# Patient Record
Sex: Female | Born: 1955
Health system: Southern US, Community
[De-identification: ages and names within clinical notes are randomized; demographics above are authoritative.]

## PROBLEM LIST (undated history)

## (undated) DIAGNOSIS — E039 Hypothyroidism, unspecified: Secondary | ICD-10-CM

## (undated) DIAGNOSIS — M1990 Unspecified osteoarthritis, unspecified site: Secondary | ICD-10-CM

## (undated) HISTORY — DX: Unspecified osteoarthritis, unspecified site: M19.90

## (undated) HISTORY — PX: JOINT REPLACEMENT: SHX530

---

## 2002-12-25 ENCOUNTER — Encounter: Payer: Self-pay | Admitting: Family Medicine

## 2002-12-25 ENCOUNTER — Ambulatory Visit (HOSPITAL_COMMUNITY): Admission: RE | Admit: 2002-12-25 | Discharge: 2002-12-25 | Payer: Self-pay | Admitting: Family Medicine

## 2005-05-25 ENCOUNTER — Other Ambulatory Visit: Admission: RE | Admit: 2005-05-25 | Discharge: 2005-05-25 | Payer: Self-pay | Admitting: Endocrinology

## 2005-09-13 ENCOUNTER — Encounter (INDEPENDENT_AMBULATORY_CARE_PROVIDER_SITE_OTHER): Payer: Self-pay | Admitting: *Deleted

## 2005-09-13 ENCOUNTER — Ambulatory Visit (HOSPITAL_COMMUNITY): Admission: RE | Admit: 2005-09-13 | Discharge: 2005-09-14 | Payer: Self-pay | Admitting: Surgery

## 2007-08-12 ENCOUNTER — Other Ambulatory Visit: Admission: RE | Admit: 2007-08-12 | Discharge: 2007-08-12 | Payer: Self-pay | Admitting: Family Medicine

## 2011-03-26 ENCOUNTER — Other Ambulatory Visit: Payer: Self-pay | Admitting: Internal Medicine

## 2011-03-29 ENCOUNTER — Ambulatory Visit
Admission: RE | Admit: 2011-03-29 | Discharge: 2011-03-29 | Disposition: A | Discharge status: Home / Self Care | Payer: 59 | Source: Ambulatory Visit | Attending: Internal Medicine | Admitting: Internal Medicine

## 2011-04-20 NOTE — Op Note (Signed)
NAMECHARNETTE, YOUNKIN NO.:  [id number]   MEDICAL RECORD NO.:  [id number]          PATIENT TYPE:  AMB   LOCATION:  DAY                          FACILITY:  [facility]   PHYSICIAN:  Velora Heckler, MD      DATE OF BIRTH:  March 15, 1956   DATE OF PROCEDURE:  09/13/2005  DATE OF DISCHARGE:                                 OPERATIVE REPORT   PREOPERATIVE DIAGNOSIS:  Right thyroid nodule.   POSTOPERATIVE DIAGNOSIS:  Right thyroid nodule.   PROCEDURE:  Right thyroid lobectomy.   SURGEON:  Velora Heckler, MD.   ASSISTANT:  Ovidio Kin, MD   ANESTHESIA:  General.   ESTIMATED BLOOD LOSS:  Minimal.   PREPARATION:  Betadine.   COMPLICATIONS:  None.   INDICATIONS:  The patient is a 55 year old white female from [place],  [place] referred by Dr. Dorisann Frames with right thyroid nodule.  This was found on self examination April 2006. The patient was sent to  [facility] where ultrasound demonstrated a 2.4 cm complex cystic  and solid mass in the inferior pole of the right thyroid lobe. Fine-needle  aspiration was obtained which demonstrated follicular cells with atypia.  Hurthle cell change was noted. The patient now comes to surgery for  resection.   DESCRIPTION OF PROCEDURE:  The procedure was done in OR #1 at the [facility]. The patient is brought to the operating room,  placed in a supine position on the operating room table. Following the  administration of general anesthesia, the patient is positioned and then  prepped and draped in the usual strict aseptic fashion. After ascertaining  that an adequate level of anesthesia had been obtained, a Kocher incision  was made with a #15 blade. Dissection was carried down through the  subcutaneous tissues and platysma, hemostasis was obtained with the  electrocautery. Skin flaps were elevated cephalad and caudad from the  thyroid notch to the sternal notch. A Mahorner self-retaining  retractor was  placed for exposure. The strap muscles are incised in the midline. The left  lobe is initially exposed. On palpation, it appears grossly normal. There  are no dominant masses.   Next we turned our attention to the right thyroid lobe. The strap muscles  are reflected laterally. Venous tributaries are divided between small  hemoclips. The gland is gently mobilized and rolled anteriorly. Large  inferior venous tributaries are ligated in continuity with 2-0 silk ties and  divided. The superior pole vessels are dissected out, ligated in continuity  with 2-0 silk ties and medium ligaclips and divided. The gland is rolled  further anteriorly. The superior parathyroid gland is identified and  preserved. Recurrent laryngeal nerve is quite prominent. It is followed  along its course and preserved. Branches of the inferior thyroid artery are  divided between small ligaclips. The ligament of Allyson Sabal is transected with  electrocautery and the gland is rolled anteriorly onto the anterior surface  of the trachea. The remaining venous tributaries are divided between small  hemoclips. The gland is mobilized across the isthmus. The isthmus is  transected at  its junction with the left thyroid lobe between hemostats. The  left lobe is suture ligated with 3-0 Vicryl suture ligatures. The right  thyroid lobe and isthmus is submitted to pathology. Dr. Star Age performed  frozen section analysis and confirms a benign nodule likely representing a  hyperplastic nodule. Hurthle cell change is noted.   The neck is irrigated with warm saline. The Surgicel is placed over the area  of the recurrent laryngeal nerve. Good hemostasis is noted. The strap  muscles are reapproximated in the midline with interrupted 3-0 Vicryl  sutures. The platysma is closed with interrupted 3-0 Vicryl sutures. The  skin is closed with running 4-0 Vicryl subcuticular suture. The wound is  washed and dried and Benzoin and  Steri-Strips are applied. Sterile dressings  are applied. The patient is awakened from anesthesia and brought to the  recovery room in stable condition. The patient tolerated the procedure well.      Velora Heckler, MD  Electronically Signed     TMG/MEDQ  D:  09/13/2005  T:  09/13/2005  Job:  [id number]   cc:   Dorisann Frames, M.D.  Fax: [phone]   Stacie Acres. Cliffton Asters, M.D.  Fax: [phone]

## 2016-07-03 ENCOUNTER — Other Ambulatory Visit: Payer: Self-pay | Admitting: Internal Medicine

## 2016-07-03 DIAGNOSIS — E042 Nontoxic multinodular goiter: Secondary | ICD-10-CM

## 2016-07-12 ENCOUNTER — Ambulatory Visit
Admission: RE | Admit: 2016-07-12 | Discharge: 2016-07-12 | Disposition: A | Discharge status: Home / Self Care | Payer: BLUE CROSS/BLUE SHIELD | Source: Ambulatory Visit | Attending: Internal Medicine | Admitting: Internal Medicine

## 2016-07-12 DIAGNOSIS — E042 Nontoxic multinodular goiter: Secondary | ICD-10-CM

## 2017-08-21 DIAGNOSIS — J301 Allergic rhinitis due to pollen: Secondary | ICD-10-CM | POA: Diagnosis not present

## 2017-08-21 DIAGNOSIS — J3089 Other allergic rhinitis: Secondary | ICD-10-CM | POA: Diagnosis not present

## 2017-08-21 DIAGNOSIS — J300 Vasomotor rhinitis: Secondary | ICD-10-CM | POA: Diagnosis not present

## 2018-12-08 DIAGNOSIS — E039 Hypothyroidism, unspecified: Secondary | ICD-10-CM | POA: Diagnosis not present

## 2018-12-08 DIAGNOSIS — E042 Nontoxic multinodular goiter: Secondary | ICD-10-CM | POA: Diagnosis not present

## 2019-03-02 ENCOUNTER — Other Ambulatory Visit: Payer: Self-pay

## 2019-03-02 ENCOUNTER — Encounter (HOSPITAL_COMMUNITY): Payer: Self-pay

## 2019-03-02 ENCOUNTER — Inpatient Hospital Stay (HOSPITAL_COMMUNITY)
Admission: EM | Admit: 2019-03-02 | Discharge: 2019-03-05 | DRG: 470 | Disposition: A | Discharge status: Home Health | Payer: BLUE CROSS/BLUE SHIELD | Attending: Family Medicine | Admitting: Family Medicine

## 2019-03-02 ENCOUNTER — Emergency Department (HOSPITAL_COMMUNITY): Payer: BLUE CROSS/BLUE SHIELD

## 2019-03-02 ENCOUNTER — Inpatient Hospital Stay (HOSPITAL_COMMUNITY): Payer: BLUE CROSS/BLUE SHIELD

## 2019-03-02 DIAGNOSIS — Z1231 Encounter for screening mammogram for malignant neoplasm of breast: Secondary | ICD-10-CM | POA: Insufficient documentation

## 2019-03-02 DIAGNOSIS — Z0000 Encounter for general adult medical examination without abnormal findings: Secondary | ICD-10-CM | POA: Insufficient documentation

## 2019-03-02 DIAGNOSIS — S50311A Abrasion of right elbow, initial encounter: Secondary | ICD-10-CM | POA: Diagnosis present

## 2019-03-02 DIAGNOSIS — J302 Other seasonal allergic rhinitis: Secondary | ICD-10-CM | POA: Diagnosis present

## 2019-03-02 DIAGNOSIS — Z79890 Hormone replacement therapy (postmenopausal): Secondary | ICD-10-CM | POA: Diagnosis not present

## 2019-03-02 DIAGNOSIS — E876 Hypokalemia: Secondary | ICD-10-CM

## 2019-03-02 DIAGNOSIS — Y93K1 Activity, walking an animal: Secondary | ICD-10-CM | POA: Diagnosis not present

## 2019-03-02 DIAGNOSIS — I447 Left bundle-branch block, unspecified: Secondary | ICD-10-CM | POA: Diagnosis not present

## 2019-03-02 DIAGNOSIS — S72001A Fracture of unspecified part of neck of right femur, initial encounter for closed fracture: Secondary | ICD-10-CM | POA: Diagnosis not present

## 2019-03-02 DIAGNOSIS — R531 Weakness: Secondary | ICD-10-CM | POA: Diagnosis not present

## 2019-03-02 DIAGNOSIS — Y929 Unspecified place or not applicable: Secondary | ICD-10-CM

## 2019-03-02 DIAGNOSIS — W1839XA Other fall on same level, initial encounter: Secondary | ICD-10-CM | POA: Diagnosis not present

## 2019-03-02 DIAGNOSIS — Z96641 Presence of right artificial hip joint: Secondary | ICD-10-CM

## 2019-03-02 DIAGNOSIS — Z01818 Encounter for other preprocedural examination: Secondary | ICD-10-CM

## 2019-03-02 DIAGNOSIS — Z471 Aftercare following joint replacement surgery: Secondary | ICD-10-CM | POA: Diagnosis not present

## 2019-03-02 DIAGNOSIS — S72041A Displaced fracture of base of neck of right femur, initial encounter for closed fracture: Secondary | ICD-10-CM | POA: Diagnosis not present

## 2019-03-02 DIAGNOSIS — S72091A Other fracture of head and neck of right femur, initial encounter for closed fracture: Secondary | ICD-10-CM | POA: Diagnosis not present

## 2019-03-02 DIAGNOSIS — E039 Hypothyroidism, unspecified: Secondary | ICD-10-CM | POA: Diagnosis not present

## 2019-03-02 DIAGNOSIS — S72011A Unspecified intracapsular fracture of right femur, initial encounter for closed fracture: Secondary | ICD-10-CM | POA: Diagnosis not present

## 2019-03-02 DIAGNOSIS — Z419 Encounter for procedure for purposes other than remedying health state, unspecified: Secondary | ICD-10-CM

## 2019-03-02 DIAGNOSIS — S60222A Contusion of left hand, initial encounter: Secondary | ICD-10-CM | POA: Diagnosis present

## 2019-03-02 DIAGNOSIS — Z23 Encounter for immunization: Secondary | ICD-10-CM | POA: Diagnosis not present

## 2019-03-02 DIAGNOSIS — M79642 Pain in left hand: Secondary | ICD-10-CM | POA: Diagnosis not present

## 2019-03-02 DIAGNOSIS — W19XXXA Unspecified fall, initial encounter: Secondary | ICD-10-CM | POA: Diagnosis not present

## 2019-03-02 HISTORY — DX: Hypothyroidism, unspecified: E03.9

## 2019-03-02 LAB — CBC WITH DIFFERENTIAL/PLATELET
Abs Immature Granulocytes: 0.07 10*3/uL (ref 0.00–0.07)
Basophils Absolute: 0 10*3/uL (ref 0.0–0.1)
Basophils Relative: 0 %
Eosinophils Absolute: 0 10*3/uL (ref 0.0–0.5)
Eosinophils Relative: 0 %
HCT: 44.3 % (ref 36.0–46.0)
Hemoglobin: 14.6 g/dL (ref 12.0–15.0)
Immature Granulocytes: 1 %
Lymphocytes Relative: 9 %
Lymphs Abs: 1 10*3/uL (ref 0.7–4.0)
MCH: 33.1 pg (ref 26.0–34.0)
MCHC: 33 g/dL (ref 30.0–36.0)
MCV: 100.5 fL — ABNORMAL HIGH (ref 80.0–100.0)
Monocytes Absolute: 0.5 10*3/uL (ref 0.1–1.0)
Monocytes Relative: 4 %
Neutro Abs: 9.9 10*3/uL — ABNORMAL HIGH (ref 1.7–7.7)
Neutrophils Relative %: 86 %
Platelets: 245 10*3/uL (ref 150–400)
RBC: 4.41 MIL/uL (ref 3.87–5.11)
RDW: 12.2 % (ref 11.5–15.5)
WBC: 11.6 10*3/uL — ABNORMAL HIGH (ref 4.0–10.5)
nRBC: 0 % (ref 0.0–0.2)

## 2019-03-02 LAB — PROTIME-INR
INR: 1 (ref 0.8–1.2)
Prothrombin Time: 12.8 seconds (ref 11.4–15.2)

## 2019-03-02 LAB — BASIC METABOLIC PANEL
Anion gap: 8 (ref 5–15)
BUN: 15 mg/dL (ref 8–23)
CO2: 26 mmol/L (ref 22–32)
Calcium: 9.3 mg/dL (ref 8.9–10.3)
Chloride: 105 mmol/L (ref 98–111)
Creatinine, Ser: 0.84 mg/dL (ref 0.44–1.00)
GFR calc Af Amer: >60 mL/min (ref >60)
GFR calc non Af Amer: >60 mL/min (ref >60)
Glucose, Bld: 113 mg/dL — ABNORMAL HIGH (ref 70–99)
Potassium: 3.4 mmol/L — ABNORMAL LOW (ref 3.5–5.1)
Sodium: 139 mmol/L (ref 135–145)

## 2019-03-02 LAB — ABO/RH: ABO/RH(D): A POS

## 2019-03-02 MED ORDER — MORPHINE SULFATE (PF) 4 MG/ML IV SOLN
4.0000 mg | INTRAVENOUS | Status: DC | PRN
  Administered 2019-03-02: 4 mg via INTRAVENOUS
  Filled 2019-03-02: qty 1

## 2019-03-02 MED ORDER — FENTANYL CITRATE (PF) 100 MCG/2ML IJ SOLN
50.0000 ug | Freq: Once | INTRAMUSCULAR | Status: DC
  Filled 2019-03-02: qty 2

## 2019-03-02 MED ORDER — METHOCARBAMOL 500 MG PO TABS
500.0000 mg | ORAL_TABLET | Freq: Four times a day (QID) | ORAL | Status: DC | PRN
  Administered 2019-03-02 – 2019-03-03 (×2): 500 mg via ORAL
  Filled 2019-03-02 (×2): qty 1

## 2019-03-02 MED ORDER — POTASSIUM CHLORIDE CRYS ER 20 MEQ PO TBCR
40.0000 meq | EXTENDED_RELEASE_TABLET | Freq: Once | ORAL | Status: AC
  Administered 2019-03-02: 40 meq via ORAL
  Filled 2019-03-02: qty 2

## 2019-03-02 MED ORDER — VITAMIN C 500 MG PO TABS
500.0000 mg | ORAL_TABLET | Freq: Every day | ORAL | Status: DC
  Administered 2019-03-04 – 2019-03-05 (×2): 500 mg via ORAL
  Filled 2019-03-02 (×2): qty 1

## 2019-03-02 MED ORDER — POLYETHYLENE GLYCOL 3350 17 G PO PACK: 17.0000 g | PACK | Freq: Every day | ORAL | Status: DC | PRN

## 2019-03-02 MED ORDER — LEVOTHYROXINE SODIUM 88 MCG PO TABS
88.0000 ug | ORAL_TABLET | Freq: Every day | ORAL | Status: DC
  Administered 2019-03-03 – 2019-03-05 (×3): 88 ug via ORAL
  Filled 2019-03-02 (×3): qty 1

## 2019-03-02 MED ORDER — TETANUS-DIPHTH-ACELL PERTUSSIS 5-2.5-18.5 LF-MCG/0.5 IM SUSP
0.5000 mL | Freq: Once | INTRAMUSCULAR | Status: AC
  Administered 2019-03-02: 0.5 mL via INTRAMUSCULAR
  Filled 2019-03-02: qty 0.5

## 2019-03-02 MED ORDER — VITAMIN D 25 MCG (1000 UNIT) PO TABS
2000.0000 [IU] | ORAL_TABLET | Freq: Two times a day (BID) | ORAL | Status: DC
  Administered 2019-03-02 – 2019-03-05 (×5): 2000 [IU] via ORAL
  Filled 2019-03-02 (×5): qty 2

## 2019-03-02 MED ORDER — MORPHINE SULFATE (PF) 2 MG/ML IV SOLN
1.0000 mg | INTRAVENOUS | Status: DC | PRN
  Administered 2019-03-02 – 2019-03-03 (×3): 2 mg via INTRAVENOUS
  Filled 2019-03-02 (×3): qty 1

## 2019-03-02 MED ORDER — LORATADINE 10 MG PO TABS
10.0000 mg | ORAL_TABLET | Freq: Every day | ORAL | Status: DC
  Administered 2019-03-02 – 2019-03-04 (×3): 10 mg via ORAL
  Filled 2019-03-02 (×3): qty 1

## 2019-03-02 MED ORDER — METHOCARBAMOL 1000 MG/10ML IJ SOLN
500.0000 mg | Freq: Four times a day (QID) | INTRAVENOUS | Status: DC | PRN
  Filled 2019-03-02: qty 5

## 2019-03-02 NOTE — ED Provider Notes (Signed)
[facility] EMERGENCY DEPARTMENT Provider Note   CSN: [id number] Arrival date & time: 03/02/19  1541    History   Chief Complaint Chief Complaint  Patient presents with  . Fall    HPI Rebecca Sims is a 63 y.o. female with a past medical history of hypothyroidism, treated with Synthroid, who presents today for evaluation after a fall.  She was outside walking her dog when her dog lunged at a squirrel pulling her to the ground.  She did not strike her head or pass out.  She denies any headache, neck pain, or back pain.  She scraped her right elbow and has pain and bruising on her left hand.  She reports the area of most pain is in her right hip.  She says that it feels like spasms.  The pain is a 2-3 until the spasm hits and then it becomes a 8 out of 10.  She denies any fevers or cough.  She reports that she has not been able to bear weight on her right hip since she fell.       HPI  History reviewed. No pertinent past medical history.  There are no active problems to display for this patient.   History reviewed. No pertinent surgical history.   OB History   No obstetric history on file.      Home Medications    Prior to Admission medications   Not on File    Family History History reviewed. No pertinent family history.  Social History Social History   Tobacco Use  . Smoking status: Not on file  Substance Use Topics  . Alcohol use: Not on file  . Drug use: Not on file     Allergies   Patient has no known allergies.   Review of Systems Review of Systems  Constitutional: Negative for chills and fever.  HENT: Negative for congestion.   Respiratory: Negative for choking and shortness of breath.   Cardiovascular: Negative for chest pain.  Gastrointestinal: Negative for abdominal pain.  Musculoskeletal: Negative for back pain and neck pain.  Skin: Positive for wound.  Neurological: Negative for dizziness, weakness and headaches.   Psychiatric/Behavioral: Negative for confusion.     Physical Exam Updated Vital Signs BP (!) 123/59 (BP Location: Right Arm)   Pulse 74   Resp 18   Ht 5\' 4"  (1.626 m)   Wt 72.6 kg   SpO2 99%   BMI 27.46 kg/m   Physical Exam Vitals signs and nursing note reviewed.  Constitutional:      General: She is not in acute distress.    Appearance: She is well-developed.  HENT:     Head: Normocephalic and atraumatic. No raccoon eyes, Battle's sign, abrasion or contusion.     Right Ear: Tympanic membrane, ear canal and external ear normal.     Left Ear: Tympanic membrane, ear canal and external ear normal.  Eyes:     Conjunctiva/sclera: Conjunctivae normal.  Neck:     Musculoskeletal: Full passive range of motion without pain, normal range of motion and neck supple. No neck rigidity or muscular tenderness.  Cardiovascular:     Rate and Rhythm: Normal rate and regular rhythm.     Pulses: Normal pulses.          Posterior tibial pulses are 2+ on the right side and 2+ on the left side.     Heart sounds: No murmur.  Pulmonary:     Effort: Pulmonary effort is normal.  No respiratory distress.     Breath sounds: Normal breath sounds.  Abdominal:     Palpations: Abdomen is soft.     Tenderness: There is no abdominal tenderness.  Musculoskeletal:     Comments: There is pain with motion of the left hip.  Unable to perform any significant range of motion secondary to pain.  There is obvious bruising of the left hand.  There is generalized tenderness to palpation of the left hand.  There is no tenderness to palpation crepitus or deformities of the left wrist, no tenderness to palpation over anatomic snuffbox bilaterally.  Full pain-free range of motion of right upper extremity and left lower extremity.  Skin:    General: Skin is warm and dry.     Comments: Large superficial abrasion present to right posterior elbow.  Neurological:     Mental Status: She is alert.      ED Treatments /  Results  Labs (all labs ordered are listed, but only abnormal results are displayed) Labs Reviewed  BASIC METABOLIC PANEL - Abnormal; Notable for the following components:      Result Value   Potassium 3.4 (*)    Glucose, Bld 113 (*)    All other components within normal limits  CBC WITH DIFFERENTIAL/PLATELET - Abnormal; Notable for the following components:   WBC 11.6 (*)    MCV 100.5 (*)    Neutro Abs 9.9 (*)    All other components within normal limits  PROTIME-INR  TYPE AND SCREEN    EKG EKG Interpretation  Date/Time:  Monday March 02 2019 17:19:49 EDT Ventricular Rate:  82 PR Interval:    QRS Duration: 153 QT Interval:  423 QTC Calculation: 495 R Axis:   -14 Text Interpretation:  Sinus rhythm Left bundle branch block Otherwise no significant change Confirmed by Melene Plan [phone]) on 03/02/2019 5:39:28 PM   Radiology Dg Hand Complete Left  Result Date: 03/02/2019 CLINICAL DATA:  Left hand pain after fall today. EXAM: LEFT HAND - COMPLETE 3+ VIEW COMPARISON:  None. FINDINGS: Mild degenerative changes over the radiocarpal joint. No acute fracture or dislocation. IMPRESSION: No acute findings. Electronically Signed   By: Elberta Fortis M.D.   On: 03/02/2019 16:56   Dg Hip Unilat With Pelvis 2-3 Views Right  Result Date: 03/02/2019 CLINICAL DATA:  Right hip pain after fall today. EXAM: DG HIP (WITH OR WITHOUT PELVIS) 2-3V RIGHT COMPARISON:  None. FINDINGS: Examination demonstrates a minimally displaced subcapital fracture of the right femoral neck. Mild fecal retention over the rectum. Remaining bony and soft tissue structures are unremarkable. IMPRESSION: Minimally displaced subcapital right femoral neck fracture. Electronically Signed   By: Elberta Fortis M.D.   On: 03/02/2019 16:55    Procedures Procedures (including critical care time)  Medications Ordered in ED Medications  morphine 4 MG/ML injection 4 mg (4 mg Intravenous Given 03/02/19 1655)  Tdap (BOOSTRIX)  injection 0.5 mL (0.5 mLs Intramuscular Given 03/02/19 1658)     Initial Impression / Assessment and Plan / ED Course  I have reviewed the triage vital signs and the nursing notes.  Pertinent labs & imaging results that were available during my care of the patient were reviewed by me and considered in my medical decision making (see chart for details).  Clinical Course as of Mar 01 1833  Mon Mar 02, 2019  1723 Spoke with Dr. Carola Frost who reccomends hospital admit.  He is concerned that she may need a total hip replacement, he is going to  touch base with 1 of his colleagues who does full joint.  He is unsure if she will go to the OR tonight or tomorrow.   [EH]    Clinical Course User Index [EH] Cristina Gong, PA-C      Rebecca Sims is a 61 woman who presents today for evaluation of right hip pain.  She was walking her dog outside when he reportedly saw a squirrel and pulled her down.  She has immediate onset of pain in her right hip and has been unable to bear weight.    Right hip x-rays were obtained showing a minimally displaced subcapital right femoral neck fracture.  She also had bruising and generalized pain in her left hand, x-rays were obtained without evidence of fracture.  She does not take any blood thinning medications, did not strike her head or pass out.  She does not have any evidence of significant head trauma, therefore no indication for CT scan of head.  She does not have any neck pain, has full active pain-free range of motion with normal sensation and function in bilateral upper extremities, no indication for CT scan of neck.  She had a large abrasion over her right arm, orders were placed for wound care.    She is unsure when her last tetanus shot was, therefore Tdap was updated.  Spoke with Dr. Carola Frost from orthopedics who will operate on patient tomorrow, she is to be n.p.o. at midnight tonight.  I spoke with Dr. Antionette Char from hospitalist team who will admit patient.   She remained hemodynamically stable while in the emergency room.  This patient was seen as a shared visit with Dr. Adela Lank.  Final Clinical Impressions(s) / ED Diagnoses   Final diagnoses:  Closed right hip fracture, initial encounter [facility]    ED Discharge Orders    None       Cristina Gong, PA-C 03/02/19 1843    Melene Plan, DO 03/02/19 1845

## 2019-03-02 NOTE — H&P (Signed)
History and Physical    TANVEE LAURO VXB:[id number] DOB: 1956-11-20 DOA: 03/02/2019  PCP: Patient, No Pcp Per   Patient coming from: home   Chief Complaint: Fall with right hip pain  HPI: TYAISA ASCHOFF is a 63 y.o. female with medical history significant for seasonal allergy and hypothyroidism, now presenting to the emergency department for evaluation of right hip pain after mechanical fall.  The patient was in her usual state of good health, having an uneventful day, and was walking her dog when the dog lunged and knocked her to the ground.  She fell onto her right side without hitting her head or losing consciousness, experienced immediate and severe pain at the right hip, and now presents for evaluation.  She also had some pain at the left elbow and right hand with abrasions.  Patient reports that she is physically active, walking her dog daily, ascending stairs without any dyspnea, and never experiences any chest pain or chest discomfort with activity.  She denies any history of heart or lung disease.  She denies any recent fevers, chills, cough, shortness of breath, dysuria, abdominal pain, or diarrhea.  ED Course: Upon arrival to the ED, patient is found to be saturating well on room air, and with remaining vitals also normal.  EKG features a sinus rhythm with left bundle branch block.  Radiographs of the left hand are negative and hip films demonstrate minimally displaced subcapsular right femoral neck fracture.  Chemistry panel is notable for slight hypokalemia and CBC features a mild leukocytosis.  Tdap was updated, morphine was given for pain control, type and screen was performed, and orthopedic surgery was consulted by the ED physician, recommending a medical admission with tentative plans for surgery tomorrow.  Review of Systems:  All other systems reviewed and apart from HPI, are negative.  Past Medical History:  Diagnosis Date  . Hypothyroidism     History reviewed. No  pertinent surgical history.   has no history on file for tobacco, alcohol, and drug.  No Known Allergies  History reviewed. No pertinent family history.   Prior to Admission medications   Not on File    Physical Exam: Vitals:   03/02/19 1549 03/02/19 1603  BP: (!) 123/59   Pulse: 74   Resp: 18   SpO2: 99%   Weight:  72.6 kg  Height:  5\' 4"  (1.626 m)    Constitutional: NAD, calm  Eyes: PERTLA, lids and conjunctivae normal ENMT: Mucous membranes are moist. Posterior pharynx clear of any exudate or lesions.   Neck: normal, supple, no masses, no thyromegaly Respiratory: clear to auscultation bilaterally, no wheezing, no crackles. Normal respiratory effort.   Cardiovascular: S1 & S2 heard, regular rate and rhythm. No extremity edema. 2+ pedal pulses.   Abdomen: No distension, no tenderness, soft. Bowel sounds active.   Musculoskeletal: no clubbing / cyanosis. Right hip tender, neurovascularly intact distally.   Skin: no significant rashes, lesions, ulcers. Warm, dry, well-perfused. Neurologic: CN 2-12 grossly intact. Sensation intact. Strength 5/5 in all 4 limbs.  Psychiatric: Alert and oriented x 3. Pleasant, cooperative.    Labs on Admission: I have personally reviewed following labs and imaging studies  CBC: Recent Labs  Lab 03/02/19 1703  WBC 11.6*  NEUTROABS 9.9*  HGB 14.6  HCT 44.3  MCV 100.5*  PLT 245   Basic Metabolic Panel: Recent Labs  Lab 03/02/19 1703  NA 139  K 3.4*  CL 105  CO2 26  GLUCOSE 113*  BUN 15  CREATININE 0.84  CALCIUM 9.3   GFR: Estimated Creatinine Clearance: 68.7 mL/min (by C-G formula based on SCr of 0.84 mg/dL). Liver Function Tests: No results for input(s): AST, ALT, ALKPHOS, BILITOT, PROT, ALBUMIN in the last 168 hours. No results for input(s): LIPASE, AMYLASE in the last 168 hours. No results for input(s): AMMONIA in the last 168 hours. Coagulation Profile: Recent Labs  Lab 03/02/19 1703  INR 1.0   Cardiac Enzymes:  No results for input(s): CKTOTAL, CKMB, CKMBINDEX, TROPONINI in the last 168 hours. BNP (last 3 results) No results for input(s): PROBNP in the last 8760 hours. HbA1C: No results for input(s): HGBA1C in the last 72 hours. CBG: No results for input(s): GLUCAP in the last 168 hours. Lipid Profile: No results for input(s): CHOL, HDL, LDLCALC, TRIG, CHOLHDL, LDLDIRECT in the last 72 hours. Thyroid Function Tests: No results for input(s): TSH, T4TOTAL, FREET4, T3FREE, THYROIDAB in the last 72 hours. Anemia Panel: No results for input(s): VITAMINB12, FOLATE, FERRITIN, TIBC, IRON, RETICCTPCT in the last 72 hours. Urine analysis: No results found for: COLORURINE, APPEARANCEUR, LABSPEC, PHURINE, GLUCOSEU, HGBUR, BILIRUBINUR, KETONESUR, PROTEINUR, UROBILINOGEN, NITRITE, LEUKOCYTESUR Sepsis Labs: @LABRCNTIP (procalcitonin:4,lacticidven:4) )No results found for this or any previous visit (from the past 240 hour(s)).   Radiological Exams on Admission: Dg Hand Complete Left  Result Date: 03/02/2019 CLINICAL DATA:  Left hand pain after fall today. EXAM: LEFT HAND - COMPLETE 3+ VIEW COMPARISON:  None. FINDINGS: Mild degenerative changes over the radiocarpal joint. No acute fracture or dislocation. IMPRESSION: No acute findings. Electronically Signed   By: Elberta Fortis M.D.   On: 03/02/2019 16:56   Dg Hip Unilat With Pelvis 2-3 Views Right  Result Date: 03/02/2019 CLINICAL DATA:  Right hip pain after fall today. EXAM: DG HIP (WITH OR WITHOUT PELVIS) 2-3V RIGHT COMPARISON:  None. FINDINGS: Examination demonstrates a minimally displaced subcapital fracture of the right femoral neck. Mild fecal retention over the rectum. Remaining bony and soft tissue structures are unremarkable. IMPRESSION: Minimally displaced subcapital right femoral neck fracture. Electronically Signed   By: Elberta Fortis M.D.   On: 03/02/2019 16:55    EKG: Independently reviewed. Sinus rhythm, LBBB, no priors available.    Assessment/Plan   1. Right hip fracture  - Presents with right hip pain after a mechanical fall  - Radiographs with minimally displaced subcapsular right femoral neck fracture  - Orthopedic surgery is consulting and much appreciated  - Based on the available data, Ms. Bohnen presents an estimated 0.14% risk of perioperative MI or cardiac arrest  - Continue pain-control and supportive care, keep NPO after midnight    2. Hypothyroidism  - Patient uncertain of Synthroid dose, will plan to continue pending pharmacy med-rec   3. LBBB  - LBBB noted on admission EKG with no priors available  - The patient is physically active, denies any known hx of heart disease, never experiences anginal sxs, ascends stairs without SOB   - No further testing needed prior to surgical hip repair    4. Hypokalemia  - Serum potassium slightly low in ED and replaced  - Repeat chem panel in am     DVT prophylaxis: SCD's  Code Status: Full  Family Communication: Discussed with patient  Consults called: Orthopedic surgery  Admission status: Inpatient     Briscoe Deutscher, MD [facility] Pager [phone]  If 7PM-7AM, please contact night-coverage [email or web address] Password [email or web address]  03/02/2019, 6:59 PM

## 2019-03-02 NOTE — Consult Note (Signed)
Orthopaedic Trauma Service Consultation  Reason for Consult: Displaced right femoral neck fracture Referring Physician: Odie Sera, MD  Rebecca Sims is an 63 y.o. female.  HPI: Patient was pulled down while walking her [place] shepherd today. 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 Skinned right elbow in the fall but denies any other complaints or LOC. Right hip was in a flexed position but now more comfortable semi-extended position.  Past Medical History:  Diagnosis Date  . Hypothyroidism     History reviewed. No pertinent surgical history.  History reviewed. No pertinent family history.  Social History:  has no history on file for tobacco, alcohol, and drug. Never smoked; works as part Science writer for [employer]; daughter lives in [place]; lives alone in town home with dog who chewed water pipe into and flooded bottom floor tonight, bedroom and full bath are both on the second floor.  Allergies: No Known Allergies  Medications: I have reviewed the patient's current medications.  Results for orders placed or performed during the hospital encounter of 03/02/19 (from the past 48 hour(s))  Basic metabolic panel     Status: Abnormal   Collection Time: 03/02/19  5:03 PM  Result Value Ref Range   Sodium 139 135 - 145 mmol/L   Potassium 3.4 (L) 3.5 - 5.1 mmol/L   Chloride 105 98 - 111 mmol/L   CO2 26 22 - 32 mmol/L   Glucose, Bld 113 (H) 70 - 99 mg/dL   BUN 15 8 - 23 mg/dL   Creatinine, Ser 1.61 0.44 - 1.00 mg/dL   Calcium 9.3 8.9 - 09.6 mg/dL   GFR calc non Af Amer >60 >60 mL/min   GFR calc Af Amer >60 >60 mL/min   Anion gap 8 5 - 15    Comment: Performed at [facility], [address] [place], [place] [address]  CBC WITH DIFFERENTIAL     Status: Abnormal   Collection Time: 03/02/19  5:03 PM   Result Value Ref Range   WBC 11.6 (H) 4.0 - 10.5 K/uL   RBC 4.41 3.87 - 5.11 MIL/uL   Hemoglobin 14.6 12.0 - 15.0 g/dL   HCT 98.1 19.1 - 47.8 %   MCV 100.5 (H) 80.0 - 100.0 fL   MCH 33.1 26.0 - 34.0 pg   MCHC 33.0 30.0 - 36.0 g/dL   RDW 29.5 62.1 - 30.8 %   Platelets 245 150 - 400 K/uL   nRBC 0.0 0.0 - 0.2 %   Neutrophils Relative % 86 %   Neutro Abs 9.9 (H) 1.7 - 7.7 K/uL   Lymphocytes Relative 9 %   Lymphs Abs 1.0 0.7 - 4.0 K/uL   Monocytes Relative 4 %   Monocytes Absolute 0.5 0.1 - 1.0 K/uL   Eosinophils Relative 0 %   Eosinophils Absolute 0.0 0.0 - 0.5 K/uL   Basophils Relative 0 %   Basophils Absolute 0.0 0.0 - 0.1 K/uL   Immature Granulocytes 1 %   Abs Immature Granulocytes 0.07 0.00 - 0.07 K/uL    Comment: Performed at [facility], [address], [place], [place] [address]  Protime-INR     Status: None   Collection Time: 03/02/19  5:03 PM  Result Value Ref Range   Prothrombin Time 12.8 11.4 - 15.2 seconds   INR 1.0  0.8 - 1.2    Comment: (NOTE) INR goal varies based on device and disease states. Performed at [facility], [address] [place], [place] [address]   Type and screen [facility]     Status: None   Collection Time: 03/02/19  5:03 PM  Result Value Ref Range   ABO/RH(D) A POS    Antibody Screen NEG    Sample Expiration      03/05/2019 Performed at [facility], [address], [place], [place] [address]   ABO/Rh     Status: None   Collection Time: 03/02/19  5:03 PM  Result Value Ref Range   ABO/RH(D)      A POS Performed at [facility], [address] [place], [place] [address]     Chest Portable 1 View  Result Date: 03/02/2019 CLINICAL DATA:  Preop chest x-ray for hip surgery. EXAM: PORTABLE CHEST 1 VIEW COMPARISON:  09/11/2005 FINDINGS: Lungs are clear. Cardiomediastinal silhouette and remainder of the exam is unchanged. IMPRESSION: No active disease. Electronically Signed   By: Elberta Fortis  M.D.   On: 03/02/2019 19:37   Dg Hand Complete Left  Result Date: 03/02/2019 CLINICAL DATA:  Left hand pain after fall today. EXAM: LEFT HAND - COMPLETE 3+ VIEW COMPARISON:  None. FINDINGS: Mild degenerative changes over the radiocarpal joint. No acute fracture or dislocation. IMPRESSION: No acute findings. Electronically Signed   By: Elberta Fortis M.D.   On: 03/02/2019 16:56   Dg Hip Unilat With Pelvis 2-3 Views Right  Result Date: 03/02/2019 CLINICAL DATA:  Right hip pain after fall today. EXAM: DG HIP (WITH OR WITHOUT PELVIS) 2-3V RIGHT COMPARISON:  None. FINDINGS: Examination demonstrates a minimally displaced subcapital fracture of the right femoral neck. Mild fecal retention over the rectum. Remaining bony and soft tissue structures are unremarkable. IMPRESSION: Minimally displaced subcapital right femoral neck fracture. Electronically Signed   By: Elberta Fortis M.D.   On: 03/02/2019 16:55    ROS No fever, chills, GI, GU complaints, MSK as above. Blood pressure (!) 133/55, pulse 78, temperature 99 F (37.2 C), temperature source Oral, resp. rate 16, height 5\' 4"  (1.626 m), weight 72.6 kg, SpO2 98 %. Physical Exam  Very pleasant A&O x 4; appropriate for stated age RUEx shoulder, wrist, digits- no skin wounds, nontender, no instability, no blocks to motion  Elbow with large abrasion in Kerlix wrap, no bleeding through  Sens  Ax/R/M/U intact  Mot   Ax/ R/ PIN/ M/ AIN/ U intact  Rad 2+ RLE Flexed abducted hip tender  No traumatic wounds, ecchymosis, or rash  No knee or ankle effusion  Sens DPN, SPN, TN intact  Motor EHL, ext, flex, evers 5/5  DP 2+, PT 2+, No significant edema LLE No traumatic wounds, ecchymosis, or rash  Nontender  No knee or ankle effusion  Knee stable to varus/ valgus and anterior/posterior stress  Sens DPN, SPN, TN intact  Motor EHL, ext, flex, evers 5/5  DP 2+, PT 2+, No significant edema  Assessment/Plan: Displaced, intracapsular femoral neck fracture  with significant shortening H/o significant weight loss; CXR negative  I have recommended THA for maximum function and lowest risk of reoperation, although I discussed cannulated screws as a possible option. Given the need for hip replacement in setting of fracture with carries a higher risk of complications, I informed the patient that this was outside my scope of practice and that it would be in the best  interest of the patient to have these injuries evaluated and treated by a fellowship trained joint surgeon. Consequently, I asked my colleague, Dr. Charlann Boxer, to provide further evaluation and management.   Surgery is expected tomorrow, likely late am.  Myrene Galas, MD [facility], [facility] [phone]  03/02/2019  9:01 PM

## 2019-03-02 NOTE — ED Notes (Signed)
Dr Carola Frost called and stated that pt could eat until midnight. Surgery scheduled for mid morning tomorrow. Will be in to speak with pt

## 2019-03-02 NOTE — Progress Notes (Signed)
Consult request has been received.  Displaced, intracapsular right hip fracture in 63 yo female.  I would recommend THA for maximum function and lowest risk of reoperation, although cannulated screws could be considered. I have discussed this case and reviewed the x-rays directly with my colleague, Dr. Charlann Boxer, who is able to proceed tomorrow if the patient is in agreement.  Patient may eat now. Full consult to follow either tomorrow am or later tonight.Myrene Galas, MD [facility], [facility] [phone]

## 2019-03-02 NOTE — ED Triage Notes (Signed)
Pt was walking dog when dog lunged at something and knocked pt to ground.pt has abrasion on right elbow. Fell onto left hip and right hand

## 2019-03-02 NOTE — ED Notes (Signed)
ED TO INPATIENT HANDOFF REPORT  ED Nurse Name and Phone #: [phone] Idelle Crouch Name/Age/Gender Rebecca Sims 63 y.o. female Room/Bed: 013C/013C  Code Status   Code Status: Full Code  Home/SNF/Other Home   Triage Complete: Triage complete  Chief Complaint Fall; Wrist Pain  Triage Note Pt was walking dog when dog lunged at something and knocked pt to ground.pt has abrasion on right elbow. Fell onto left hip and right hand   Allergies No Known Allergies  Level of Care/Admitting Diagnosis ED Disposition    ED Disposition Condition Comment   Admit  Hospital Area: [facility] [[id number]]  Level of Care: Med-Surg [16]  Diagnosis: Closed fracture of right hip, initial encounter [facility]) [[id number]]  Admitting Physician: Briscoe Deutscher [[id number]]  Attending Physician: Briscoe Deutscher [[id number]]  Estimated length of stay: past midnight tomorrow  Certification:: I certify this patient will need inpatient services for at least 2 midnights  PT Class (Do Not Modify): Inpatient [101]  PT Acc Code (Do Not Modify): Private [1]       B Medical/Surgery History History reviewed. No pertinent past medical history. History reviewed. No pertinent surgical history.   A IV Location/Drains/Wounds Patient Lines/Drains/Airways Status   Active Line/Drains/Airways    Name:   Placement date:   Placement time:   Site:   Days:   Peripheral IV 03/02/19 Left Antecubital   03/02/19    1656    Antecubital   less than 1          Intake/Output Last 24 hours No intake or output data in the 24 hours ending 03/02/19 1839  Labs/Imaging Results for orders placed or performed during the hospital encounter of 03/02/19 (from the past 48 hour(s))  Basic metabolic panel     Status: Abnormal   Collection Time: 03/02/19  5:03 PM  Result Value Ref Range   Sodium 139 135 - 145 mmol/L   Potassium 3.4 (L) 3.5 - 5.1 mmol/L   Chloride 105 98 - 111 mmol/L   CO2 26 22 - 32 mmol/L   Glucose,  Bld 113 (H) 70 - 99 mg/dL   BUN 15 8 - 23 mg/dL   Creatinine, Ser 5.94 0.44 - 1.00 mg/dL   Calcium 9.3 8.9 - 58.5 mg/dL   GFR calc non Af Amer >60 >60 mL/min   GFR calc Af Amer >60 >60 mL/min   Anion gap 8 5 - 15    Comment: Performed at [facility], [address] [place], [place] [address]  CBC WITH DIFFERENTIAL     Status: Abnormal   Collection Time: 03/02/19  5:03 PM  Result Value Ref Range   WBC 11.6 (H) 4.0 - 10.5 K/uL   RBC 4.41 3.87 - 5.11 MIL/uL   Hemoglobin 14.6 12.0 - 15.0 g/dL   HCT 46.2 86.3 - 81.7 %   MCV 100.5 (H) 80.0 - 100.0 fL   MCH 33.1 26.0 - 34.0 pg   MCHC 33.0 30.0 - 36.0 g/dL   RDW 71.1 65.7 - 90.3 %   Platelets 245 150 - 400 K/uL   nRBC 0.0 0.0 - 0.2 %   Neutrophils Relative % 86 %   Neutro Abs 9.9 (H) 1.7 - 7.7 K/uL   Lymphocytes Relative 9 %   Lymphs Abs 1.0 0.7 - 4.0 K/uL   Monocytes Relative 4 %   Monocytes Absolute 0.5 0.1 - 1.0 K/uL   Eosinophils Relative 0 %   Eosinophils Absolute 0.0 0.0 -  0.5 K/uL   Basophils Relative 0 %   Basophils Absolute 0.0 0.0 - 0.1 K/uL   Immature Granulocytes 1 %   Abs Immature Granulocytes 0.07 0.00 - 0.07 K/uL    Comment: Performed at [facility], [address] [place], [place] [address]  Protime-INR     Status: None   Collection Time: 03/02/19  5:03 PM  Result Value Ref Range   Prothrombin Time 12.8 11.4 - 15.2 seconds   INR 1.0 0.8 - 1.2    Comment: (NOTE) INR goal varies based on device and disease states. Performed at [facility], [address] [place], [place] [address]   Type and screen [facility]     Status: None (Preliminary result)   Collection Time: 03/02/19  5:03 PM  Result Value Ref Range   ABO/RH(D) A POS    Antibody Screen PENDING    Sample Expiration      03/05/2019 Performed at [facility], [address], [place], [place] [address]    Dg Hand Complete Left  Result Date: 03/02/2019 CLINICAL DATA:  Left hand pain after fall today. EXAM:  LEFT HAND - COMPLETE 3+ VIEW COMPARISON:  None. FINDINGS: Mild degenerative changes over the radiocarpal joint. No acute fracture or dislocation. IMPRESSION: No acute findings. Electronically Signed   By: Elberta Fortis M.D.   On: 03/02/2019 16:56   Dg Hip Unilat With Pelvis 2-3 Views Right  Result Date: 03/02/2019 CLINICAL DATA:  Right hip pain after fall today. EXAM: DG HIP (WITH OR WITHOUT PELVIS) 2-3V RIGHT COMPARISON:  None. FINDINGS: Examination demonstrates a minimally displaced subcapital fracture of the right femoral neck. Mild fecal retention over the rectum. Remaining bony and soft tissue structures are unremarkable. IMPRESSION: Minimally displaced subcapital right femoral neck fracture. Electronically Signed   By: Elberta Fortis M.D.   On: 03/02/2019 16:55    Pending Labs Unresulted Labs (From admission, onward)    Start     Ordered   03/03/19 0500  HIV antibody (Routine Testing)  Tomorrow morning,   R     03/02/19 1838   03/03/19 0500  Basic metabolic panel  Tomorrow morning,   R     03/02/19 1838   03/03/19 0500  CBC  Tomorrow morning,   R     03/02/19 1838   03/03/19 0500  Magnesium  Tomorrow morning,   R     03/02/19 1838          Vitals/Pain Today's Vitals   03/02/19 1549 03/02/19 1602 03/02/19 1603  BP: (!) 123/59    Pulse: 74    Resp: 18    SpO2: 99%    Weight:   72.6 kg  Height:    (1.626 m)  PainSc:  0-No pain     Isolation Precautions No active isolations  Medications Medications  potassium chloride SA (K-DUR,KLOR-CON) CR tablet 40 mEq (has no administration in time range)  morphine 2 MG/ML injection 1-2 mg (has no administration in time range)  methocarbamol (ROBAXIN) tablet 500 mg (has no administration in time range)    Or  methocarbamol (ROBAXIN) 500 mg in dextrose 5 % 50 mL IVPB (has no administration in time range)  polyethylene glycol (MIRALAX / GLYCOLAX) packet 17 g (has no administration in time range)  Tdap (BOOSTRIX) injection 0.5 mL  (0.5 mLs Intramuscular Given 03/02/19 1658)    Mobility {Mobility:- pt has broken hip, ambulatory prior High Fall risk  Focused Assessments  Ortho, right hip fracture   R Recommendations: See Admitting Provider Note  Report given to:   Additional Notes: surgery tomorrow with Dr. Carola Frost

## 2019-03-03 ENCOUNTER — Encounter (HOSPITAL_COMMUNITY): Payer: Self-pay

## 2019-03-03 ENCOUNTER — Inpatient Hospital Stay (HOSPITAL_COMMUNITY): Payer: BLUE CROSS/BLUE SHIELD

## 2019-03-03 ENCOUNTER — Inpatient Hospital Stay (HOSPITAL_COMMUNITY): Payer: BLUE CROSS/BLUE SHIELD | Admitting: Certified Registered"

## 2019-03-03 ENCOUNTER — Encounter (HOSPITAL_COMMUNITY)
Admission: EM | Disposition: A | Discharge status: Home Health | Payer: Self-pay | Source: Home / Self Care | Attending: Family Medicine

## 2019-03-03 HISTORY — PX: TOTAL HIP ARTHROPLASTY: SHX124

## 2019-03-03 LAB — COMPREHENSIVE METABOLIC PANEL
ALT: 21 U/L (ref 0–44)
AST: 27 U/L (ref 15–41)
Albumin: 3.8 g/dL (ref 3.5–5.0)
Alkaline Phosphatase: 56 U/L (ref 38–126)
Anion gap: 9 (ref 5–15)
BUN: 14 mg/dL (ref 8–23)
CALCIUM: 8.9 mg/dL (ref 8.9–10.3)
CO2: 25 mmol/L (ref 22–32)
Chloride: 105 mmol/L (ref 98–111)
Creatinine, Ser: 0.62 mg/dL (ref 0.44–1.00)
GFR calc Af Amer: >60 mL/min (ref >60)
GFR calc non Af Amer: >60 mL/min (ref >60)
Glucose, Bld: 104 mg/dL — ABNORMAL HIGH (ref 70–99)
Potassium: 3.9 mmol/L (ref 3.5–5.1)
Sodium: 139 mmol/L (ref 135–145)
Total Bilirubin: 1.2 mg/dL (ref 0.3–1.2)
Total Protein: 6 g/dL — ABNORMAL LOW (ref 6.5–8.1)

## 2019-03-03 LAB — CBC
HCT: 41.6 % (ref 36.0–46.0)
Hemoglobin: 13.9 g/dL (ref 12.0–15.0)
MCH: 33.2 pg (ref 26.0–34.0)
MCHC: 33.4 g/dL (ref 30.0–36.0)
MCV: 99.3 fL (ref 80.0–100.0)
Platelets: 243 10*3/uL (ref 150–400)
RBC: 4.19 MIL/uL (ref 3.87–5.11)
RDW: 12.3 % (ref 11.5–15.5)
WBC: 6.9 10*3/uL (ref 4.0–10.5)
nRBC: 0 % (ref 0.0–0.2)

## 2019-03-03 LAB — PROTIME-INR
INR: 1 (ref 0.8–1.2)
Prothrombin Time: 13.5 seconds (ref 11.4–15.2)

## 2019-03-03 LAB — MAGNESIUM: Magnesium: 2.2 mg/dL (ref 1.7–2.4)

## 2019-03-03 LAB — MRSA PCR SCREENING: MRSA by PCR: NEGATIVE

## 2019-03-03 LAB — TYPE AND SCREEN
ABO/RH(D): A POS
Antibody Screen: NEGATIVE

## 2019-03-03 LAB — HIV ANTIBODY (ROUTINE TESTING W REFLEX): HIV Screen 4th Generation wRfx: NONREACTIVE

## 2019-03-03 LAB — TSH: TSH: 1.151 u[IU]/mL (ref 0.350–4.500)

## 2019-03-03 LAB — PREALBUMIN: Prealbumin: 23.3 mg/dL (ref 18–38)

## 2019-03-03 LAB — APTT: aPTT: 28 seconds (ref 24–36)

## 2019-03-03 SURGERY — ARTHROPLASTY, HIP, TOTAL, ANTERIOR APPROACH
Anesthesia: Spinal | Laterality: Right

## 2019-03-03 MED ORDER — ONDANSETRON HCL 4 MG/2ML IJ SOLN: 4.0000 mg | Freq: Four times a day (QID) | INTRAMUSCULAR | Status: DC | PRN

## 2019-03-03 MED ORDER — SODIUM CHLORIDE 0.9 % IR SOLN
Status: DC | PRN
  Administered 2019-03-03: 3000 mL

## 2019-03-03 MED ORDER — METOCLOPRAMIDE HCL 5 MG/ML IJ SOLN: 5.0000 mg | Freq: Three times a day (TID) | INTRAMUSCULAR | Status: DC | PRN

## 2019-03-03 MED ORDER — SODIUM CHLORIDE 0.9 % IV SOLN
INTRAVENOUS | Status: DC | PRN
  Administered 2019-03-03: 30 ug/min via INTRAVENOUS

## 2019-03-03 MED ORDER — PHENOL 1.4 % MT LIQD: 1.0000 | OROMUCOSAL | Status: DC | PRN

## 2019-03-03 MED ORDER — PHENYLEPHRINE HCL 10 MG/ML IJ SOLN
INTRAMUSCULAR | Status: DC | PRN
  Administered 2019-03-03 (×4): 80 ug via INTRAVENOUS

## 2019-03-03 MED ORDER — TRANEXAMIC ACID-NACL 1000-0.7 MG/100ML-% IV SOLN
1000.0000 mg | INTRAVENOUS | Status: AC
  Administered 2019-03-03: 1000 mg via INTRAVENOUS
  Filled 2019-03-03: qty 100

## 2019-03-03 MED ORDER — EPHEDRINE 5 MG/ML INJ
INTRAVENOUS | Status: AC
  Filled 2019-03-03: qty 10

## 2019-03-03 MED ORDER — METOCLOPRAMIDE HCL 5 MG PO TABS: 5.0000 mg | ORAL_TABLET | Freq: Three times a day (TID) | ORAL | Status: DC | PRN

## 2019-03-03 MED ORDER — ENSURE MAX PROTEIN PO LIQD
11.0000 [oz_av] | Freq: Every day | ORAL | Status: DC
  Administered 2019-03-03 – 2019-03-05 (×3): 11 [oz_av] via ORAL
  Filled 2019-03-03 (×3): qty 330

## 2019-03-03 MED ORDER — LIDOCAINE 2% (20 MG/ML) 5 ML SYRINGE
INTRAMUSCULAR | Status: AC
  Filled 2019-03-03: qty 5

## 2019-03-03 MED ORDER — BUPIVACAINE IN DEXTROSE 0.75-8.25 % IT SOLN
INTRATHECAL | Status: DC | PRN
  Administered 2019-03-03: 1.6 mL via INTRATHECAL

## 2019-03-03 MED ORDER — LACTATED RINGERS IV SOLN
INTRAVENOUS | Status: DC | PRN
  Administered 2019-03-03: 10:00:00 via INTRAVENOUS

## 2019-03-03 MED ORDER — CEFAZOLIN SODIUM-DEXTROSE 2-4 GM/100ML-% IV SOLN
2.0000 g | Freq: Once | INTRAVENOUS | Status: AC
  Administered 2019-03-03: 2 g via INTRAVENOUS
  Filled 2019-03-03: qty 100

## 2019-03-03 MED ORDER — DEXAMETHASONE SODIUM PHOSPHATE 10 MG/ML IJ SOLN
INTRAMUSCULAR | Status: AC
  Filled 2019-03-03: qty 1

## 2019-03-03 MED ORDER — MIDAZOLAM HCL 2 MG/2ML IJ SOLN
INTRAMUSCULAR | Status: DC | PRN
  Administered 2019-03-03: 2 mg via INTRAVENOUS

## 2019-03-03 MED ORDER — PROPOFOL 500 MG/50ML IV EMUL
INTRAVENOUS | Status: DC | PRN
  Administered 2019-03-03: 80 ug/kg/min via INTRAVENOUS

## 2019-03-03 MED ORDER — PROPOFOL 10 MG/ML IV BOLUS
INTRAVENOUS | Status: AC
  Filled 2019-03-03: qty 20

## 2019-03-03 MED ORDER — MENTHOL 3 MG MT LOZG: 1.0000 | LOZENGE | OROMUCOSAL | Status: DC | PRN

## 2019-03-03 MED ORDER — KETAMINE HCL 10 MG/ML IJ SOLN
INTRAMUSCULAR | Status: DC | PRN
  Administered 2019-03-03: 20 mg via INTRAVENOUS

## 2019-03-03 MED ORDER — ASPIRIN EC 325 MG PO TBEC
325.0000 mg | DELAYED_RELEASE_TABLET | Freq: Two times a day (BID) | ORAL | Status: DC
  Administered 2019-03-04 – 2019-03-05 (×3): 325 mg via ORAL
  Filled 2019-03-03 (×3): qty 1

## 2019-03-03 MED ORDER — PROPOFOL 500 MG/50ML IV EMUL: INTRAVENOUS | Status: DC | PRN

## 2019-03-03 MED ORDER — FENTANYL CITRATE (PF) 250 MCG/5ML IJ SOLN
INTRAMUSCULAR | Status: AC
  Filled 2019-03-03: qty 5

## 2019-03-03 MED ORDER — ONDANSETRON HCL 4 MG PO TABS: 4.0000 mg | ORAL_TABLET | Freq: Four times a day (QID) | ORAL | Status: DC | PRN

## 2019-03-03 MED ORDER — FENTANYL CITRATE (PF) 250 MCG/5ML IJ SOLN
INTRAMUSCULAR | Status: DC | PRN
  Administered 2019-03-03: 50 ug via INTRAVENOUS

## 2019-03-03 MED ORDER — CEFAZOLIN SODIUM-DEXTROSE 2-4 GM/100ML-% IV SOLN
2.0000 g | Freq: Four times a day (QID) | INTRAVENOUS | Status: AC
  Administered 2019-03-03 (×2): 2 g via INTRAVENOUS
  Filled 2019-03-03 (×2): qty 100

## 2019-03-03 MED ORDER — FERROUS SULFATE 325 (65 FE) MG PO TABS
325.0000 mg | ORAL_TABLET | Freq: Three times a day (TID) | ORAL | Status: DC
  Administered 2019-03-03 – 2019-03-05 (×7): 325 mg via ORAL
  Filled 2019-03-03 (×7): qty 1

## 2019-03-03 MED ORDER — MIDAZOLAM HCL 2 MG/2ML IJ SOLN
INTRAMUSCULAR | Status: AC
  Filled 2019-03-03: qty 2

## 2019-03-03 MED ORDER — PROPOFOL 10 MG/ML IV BOLUS
INTRAVENOUS | Status: DC | PRN
  Administered 2019-03-03: 20 mg via INTRAVENOUS

## 2019-03-03 MED ORDER — HYDROMORPHONE HCL 1 MG/ML IJ SOLN: 0.5000 mg | INTRAMUSCULAR | Status: DC | PRN

## 2019-03-03 MED ORDER — DEXMEDETOMIDINE HCL IN NACL 200 MCG/50ML IV SOLN
INTRAVENOUS | Status: AC
  Filled 2019-03-03: qty 100

## 2019-03-03 MED ORDER — KETAMINE HCL 50 MG/5ML IJ SOSY
PREFILLED_SYRINGE | INTRAMUSCULAR | Status: AC
  Filled 2019-03-03: qty 5

## 2019-03-03 MED ORDER — DOCUSATE SODIUM 100 MG PO CAPS
100.0000 mg | ORAL_CAPSULE | Freq: Two times a day (BID) | ORAL | Status: DC
  Administered 2019-03-03 – 2019-03-04 (×3): 100 mg via ORAL
  Filled 2019-03-03 (×3): qty 1

## 2019-03-03 MED ORDER — ACETAMINOPHEN 500 MG PO TABS
1000.0000 mg | ORAL_TABLET | Freq: Once | ORAL | Status: AC
  Administered 2019-03-03: 1000 mg via ORAL
  Filled 2019-03-03: qty 2

## 2019-03-03 MED ORDER — ONDANSETRON HCL 4 MG/2ML IJ SOLN
INTRAMUSCULAR | Status: AC
  Filled 2019-03-03: qty 2

## 2019-03-03 MED ORDER — PHENYLEPHRINE HCL 10 MG/ML IJ SOLN
INTRAMUSCULAR | Status: AC
  Filled 2019-03-03: qty 1

## 2019-03-03 MED ORDER — HYDROCODONE-ACETAMINOPHEN 7.5-325 MG PO TABS
1.0000 | ORAL_TABLET | ORAL | Status: DC | PRN
  Administered 2019-03-03: 1 via ORAL
  Administered 2019-03-04: 2 via ORAL
  Administered 2019-03-04: 1 via ORAL
  Filled 2019-03-03: qty 1
  Filled 2019-03-03: qty 2
  Filled 2019-03-03: qty 1

## 2019-03-03 MED ORDER — ADULT MULTIVITAMIN W/MINERALS CH
1.0000 | ORAL_TABLET | Freq: Every day | ORAL | Status: DC
  Administered 2019-03-03 – 2019-03-05 (×3): 1 via ORAL
  Filled 2019-03-03 (×3): qty 1

## 2019-03-03 MED ORDER — 0.9 % SODIUM CHLORIDE (POUR BTL) OPTIME
TOPICAL | Status: DC | PRN
  Administered 2019-03-03: 1000 mL

## 2019-03-03 MED ORDER — SUCCINYLCHOLINE CHLORIDE 200 MG/10ML IV SOSY
PREFILLED_SYRINGE | INTRAVENOUS | Status: AC
  Filled 2019-03-03: qty 10

## 2019-03-03 SURGICAL SUPPLY — 60 items
BLADE SAW SGTL 18X1.27X75 (BLADE) ×2 IMPLANT
BLADE SAW SGTL 18X1.27X75MM (BLADE) ×1
COVER SURGICAL LIGHT HANDLE (MISCELLANEOUS) ×3 IMPLANT
COVER WAND RF STERILE (DRAPES) ×3 IMPLANT
CUP ACET PINNACLE SECTR 50MM (Hips) ×1 IMPLANT
DERMABOND ADHESIVE PROPEN (GAUZE/BANDAGES/DRESSINGS) ×2
DERMABOND ADVANCED (GAUZE/BANDAGES/DRESSINGS) ×2
DERMABOND ADVANCED .7 DNX12 (GAUZE/BANDAGES/DRESSINGS) ×1 IMPLANT
DERMABOND ADVANCED .7 DNX6 (GAUZE/BANDAGES/DRESSINGS) ×1 IMPLANT
DRAPE IMP U-DRAPE 54X76 (DRAPES) ×3 IMPLANT
DRAPE INCISE IOBAN 85X60 (DRAPES) ×3 IMPLANT
DRAPE ORTHO SPLIT 77X108 STRL (DRAPES) ×4
DRAPE SURG ORHT 6 SPLT 77X108 (DRAPES) ×2 IMPLANT
DRAPE U-SHAPE 47X51 STRL (DRAPES) ×3 IMPLANT
DRSG AQUACEL AG ADV 3.5X10 (GAUZE/BANDAGES/DRESSINGS) ×3 IMPLANT
DRSG OPSITE POSTOP 4X6 (GAUZE/BANDAGES/DRESSINGS) ×3 IMPLANT
DURAPREP 26ML APPLICATOR (WOUND CARE) ×3 IMPLANT
ELECT BLADE 4.0 EZ CLEAN MEGAD (MISCELLANEOUS) ×3
ELECT REM PT RETURN 9FT ADLT (ELECTROSURGICAL) ×3
ELECTRODE BLDE 4.0 EZ CLN MEGD (MISCELLANEOUS) ×1 IMPLANT
ELECTRODE REM PT RTRN 9FT ADLT (ELECTROSURGICAL) ×1 IMPLANT
ELIMINATOR HOLE APEX DEPUY (Hips) ×3 IMPLANT
EVACUATOR 1/8 PVC DRAIN (DRAIN) IMPLANT
FACESHIELD WRAPAROUND (MASK) ×6 IMPLANT
GLOVE BIOGEL PI IND STRL 7.5 (GLOVE) ×1 IMPLANT
GLOVE BIOGEL PI IND STRL 8.5 (GLOVE) ×2 IMPLANT
GLOVE BIOGEL PI INDICATOR 7.5 (GLOVE) ×2
GLOVE BIOGEL PI INDICATOR 8.5 (GLOVE) ×4
GLOVE ECLIPSE 8.0 STRL XLNG CF (GLOVE) ×3 IMPLANT
GLOVE ORTHO TXT STRL SZ7.5 (GLOVE) ×3 IMPLANT
GOWN STRL REUS W/ TWL LRG LVL3 (GOWN DISPOSABLE) ×3 IMPLANT
GOWN STRL REUS W/TWL 2XL LVL3 (GOWN DISPOSABLE) ×3 IMPLANT
GOWN STRL REUS W/TWL LRG LVL3 (GOWN DISPOSABLE) ×6
HANDPIECE INTERPULSE COAX TIP (DISPOSABLE)
HEAD FEMORAL 32 CERAMIC (Hips) ×3 IMPLANT
IMMOBILIZER KNEE 22 UNIV (SOFTGOODS) ×3 IMPLANT
KIT BASIN OR (CUSTOM PROCEDURE TRAY) ×3 IMPLANT
KIT TURNOVER KIT B (KITS) ×3 IMPLANT
LINER ACET PNNCL PLUS4 NEUTRAL (Hips) ×1 IMPLANT
MANIFOLD NEPTUNE II (INSTRUMENTS) ×3 IMPLANT
NS IRRIG 1000ML POUR BTL (IV SOLUTION) ×3 IMPLANT
PACK TOTAL JOINT (CUSTOM PROCEDURE TRAY) ×3 IMPLANT
PACK UNIVERSAL I (CUSTOM PROCEDURE TRAY) ×3 IMPLANT
PAD ARMBOARD 7.5X6 YLW CONV (MISCELLANEOUS) ×6 IMPLANT
PINNACLE PLUS 4 NEUTRAL (Hips) ×3 IMPLANT
PINNACLE SECTOR CUP 50MM (Hips) ×3 IMPLANT
SCREW 6.5MMX30MM (Screw) ×3 IMPLANT
SET HNDPC FAN SPRY TIP SCT (DISPOSABLE) IMPLANT
SPONGE LAP 4X18 RFD (DISPOSABLE) ×6 IMPLANT
STEM FEM ACTIS STD SZ4 (Stem) ×3 IMPLANT
SUT MNCRL AB 4-0 PS2 18 (SUTURE) IMPLANT
SUT VIC AB 1 CT1 27 (SUTURE) ×4
SUT VIC AB 1 CT1 27XBRD ANBCTR (SUTURE) ×2 IMPLANT
SUT VIC AB 2-0 CT1 27 (SUTURE)
SUT VIC AB 2-0 CT1 TAPERPNT 27 (SUTURE) IMPLANT
SUT VLOC 180 0 24IN GS25 (SUTURE) ×3 IMPLANT
TOWEL OR 17X24 6PK STRL BLUE (TOWEL DISPOSABLE) ×3 IMPLANT
TOWEL OR 17X26 10 PK STRL BLUE (TOWEL DISPOSABLE) ×3 IMPLANT
TRAY FOLEY W/BAG SLVR 14FR (SET/KITS/TRAYS/PACK) IMPLANT
WATER STERILE IRR 1000ML POUR (IV SOLUTION) ×12 IMPLANT

## 2019-03-03 NOTE — Op Note (Signed)
NAME:  Rebecca Sims                ACCOUNT NO.: [id number]      MEDICAL RECORD NO.: [id number]      FACILITY:  [facility]      PHYSICIAN:  Shelda Pal  DATE OF BIRTH:  11/25/1956     DATE OF PROCEDURE:  03/03/2019                                 OPERATIVE REPORT         PREOPERATIVE DIAGNOSIS: Right displaced femoral neck hip fracture.      POSTOPERATIVE DIAGNOSIS:  Right displaced femoral neck hip fracture.      PROCEDURE:  Right total hip replacement through an anterior approach   utilizing DePuy THR system, component size 59mm pinnacle cup, a size 32+4 neutral   Altrex liner, a size 4 standard Actis femoral stem with a 32+1 delta ceramic   ball.      SURGEON:  Madlyn Frankel. Charlann Boxer, M.D.      ASSISTANT:  Lanney Gins, PA-C     ANESTHESIA:  Spinal.      SPECIMENS:  None.      COMPLICATIONS:  None.      BLOOD LOSS:  350 cc     DRAINS:  None.      INDICATION OF THE PROCEDURE:  Rebecca Sims is a 63 y.o. female . Very pleasant 63 yo female was pulled down while walking her [place] shepherd yesterday.  She was found to have a displaced femoral neck fracture. Pros and cons of treatment options were reviewed and discussed.  Opting for total hip replacement for treatment based on radiographic pattern, age, post operative expectation.  Consent was obtained for benefit of pain relief.  Specific risks of infection, DVT, component   failure, dislocation, neurovascular injury, and need for revision surgery were reviewed in the office as well discussion of   the anterior versus posterior approach were reviewed.     PROCEDURE IN DETAIL:  The patient was brought to operative theater.   Once adequate anesthesia, preoperative antibiotics, 2 gm of Ancef, 1 gm of Tranexamic Acid, and 10 mg of Decadron were administered, the patient was positioned supine on the [employer] table.  Once the patient was safely positioned with adequate padding of boney prominences we  predraped out the hip, and used fluoroscopy to confirm orientation of the pelvis.      The right hip was then prepped and draped from proximal iliac crest to   mid thigh with a shower curtain technique.      Time-out was performed identifying the patient, planned procedure, and the appropriate extremity.     An incision was then made 2 cm lateral to the   anterior superior iliac spine extending over the orientation of the   tensor fascia lata muscle and sharp dissection was carried down to the   fascia of the muscle.      The fascia was then incised.  The muscle belly was identified and swept   laterally and retractor placed along the superior neck.  Following   cauterization of the circumflex vessels and removing some pericapsular   fat, a second cobra retractor was placed on the inferior neck.  A T-capsulotomy was made along the line of the   superior neck to the trochanteric fossa, then extended proximally and  distally.  Tag sutures were placed and the retractors were then placed   intracapsular.  We then identified the trochanteric fossa and   orientation of my neck cut and then made a neck osteotomy with the femur on traction.  The femoral   head was removed without difficulty or complication.  Traction was let   off and retractors were placed posterior and anterior around the   acetabulum.      The labrum and foveal tissue were debrided.  I began reaming with a 44 mm   reamer and reamed up to 49 mm reamer with good bony bed preparation and a 50 mm  cup was chosen.  The final 50 mm Pinnacle cup was then impacted under fluoroscopy to confirm the depth of penetration and orientation with respect to   Abduction and forward flexion.  A screw was placed into the ilium followed by the hole eliminator.  The final   32+4 neutral Altrex liner was impacted with good visualized rim fit.  The cup was positioned anatomically within the acetabular portion of the pelvis.      At this point, the  femur was rolled to 100 degrees.  Further capsule was   released off the inferior aspect of the femoral neck.  I then   released the superior capsule proximally.  With the leg in a neutral position the hook was placed laterally   along the femur under the vastus lateralis origin and elevated manually and then held in position using the hook attachment on the bed.  The leg was then extended and adducted with the leg rolled to 100   degrees of external rotation.  Retractors were placed along the medial calcar and posteriorly over the greater trochanter.  Once the proximal femur was fully   exposed, I used a box osteotome to set orientation.  I then began   broaching with the starting chili pepper broach and passed this by hand and then broached up to 4.  With the 4 broach in place I chose a standard versus high offset neck and did several trial reductions.  The offset was appropriate, leg lengths   appeared to be equal best matched with the +1 head ball trial confirmed radiographically.   Given these findings, I went ahead and dislocated the hip, repositioned all   retractors and positioned the right hip in the extended and abducted position.  The final 4 standard Actis femoral stem was   chosen and it was impacted down to the level of neck cut.  Based on this   and the trial reductions, a final 32+1 delta ceramic ball was chosen and   impacted onto a clean and dry trunnion, and the hip was reduced.  The   hip had been irrigated throughout the case again at this point.  I did   reapproximate the superior capsular leaflet to the anterior leaflet   using #1 Vicryl.  The fascia of the   tensor fascia lata muscle was then reapproximated using #1 Vicryl and #0 Stratafix sutures.  The   remaining wound was closed with 2-0 Vicryl and running 4-0 Monocryl.   The hip was cleaned, dried, and dressed sterilely using Dermabond and   Aquacel dressing.  The patient was then brought   to recovery room in stable  condition tolerating the procedure well.    Lanney Gins, PA-C was present for the entirety of the case involved from   preoperative positioning, perioperative retractor management, general  facilitation of the case, as well as primary wound closure as assistant.            Madlyn Frankel Charlann Boxer, M.D.        03/03/2019 10:04 AM

## 2019-03-03 NOTE — Anesthesia Postprocedure Evaluation (Signed)
Anesthesia Post Note  Patient: Rebecca Sims  Procedure(s) Performed: TOTAL HIP ARTHROPLASTY ANTERIOR APPROACH (Right )     Patient location during evaluation: PACU Anesthesia Type: Spinal Level of consciousness: oriented, awake and alert and awake Pain management: pain level controlled Vital Signs Assessment: post-procedure vital signs reviewed and stable Respiratory status: spontaneous breathing, respiratory function stable, patient connected to nasal cannula oxygen and nonlabored ventilation Cardiovascular status: blood pressure returned to baseline and stable Postop Assessment: no headache, no backache, no apparent nausea or vomiting, spinal receding and patient able to bend at knees Anesthetic complications: no    Last Vitals:  Vitals:   03/03/19 1310 03/03/19 1311  BP: (!) 101/56   Pulse: 72 77  Resp: 18 17  Temp: 36.6 C   SpO2: 97% 95%    Last Pain:  Vitals:   03/03/19 1327  TempSrc:   PainSc: 8                  Cecile Hearing

## 2019-03-03 NOTE — Anesthesia Procedure Notes (Signed)
Spinal  Patient location during procedure: OR Start time: 03/03/2019 10:14 AM End time: 03/03/2019 10:19 AM Staffing Anesthesiologist: Cecile Hearing, MD Performed: anesthesiologist  Preanesthetic Checklist Completed: patient identified, site marked, surgical consent, pre-op evaluation, timeout performed, IV checked, risks and benefits discussed and monitors and equipment checked Spinal Block Patient position: right lateral decubitus Prep: DuraPrep Patient monitoring: heart rate, cardiac monitor, continuous pulse ox and blood pressure Approach: midline Location: L3-4 Injection technique: single-shot Needle Needle type: Pencan  Needle gauge: 24 G Needle length: 9 cm Assessment Sensory level: T10

## 2019-03-03 NOTE — Discharge Instructions (Addendum)
1) you are taking aspirin for blood thinner so Avoid ibuprofen/Advil/Aleve/Motrin/Goody Powders/Naproxen/BC powders/Meloxicam/Diclofenac/Indomethacin and other Nonsteroidal anti-inflammatory medications as these will make you more likely to bleed and can cause stomach ulcers, can also cause Kidney problems.   2) activity limitations/restrictions as advised by orthopedic surgeon and physical therapist  3) outpatient follow-up with orthopedic surgeon as advised   INSTRUCTIONS AFTER JOINT REPLACEMENT   o Remove items at home which could result in a fall. This includes throw rugs or furniture in walking pathways o ICE to the affected joint every three hours while awake for 30 minutes at a time, for at least the first 3-5 days, and then as needed for pain and swelling.  Continue to use ice for pain and swelling. You may notice swelling that will progress down to the foot and ankle.  This is normal after surgery.  Elevate your leg when you are not up walking on it.   o Continue to use the breathing machine you got in the hospital (incentive spirometer) which will help keep your temperature down.  It is common for your temperature to cycle up and down following surgery, especially at night when you are not up moving around and exerting yourself.  The breathing machine keeps your lungs expanded and your temperature down.   DIET:  As you were doing prior to hospitalization, we recommend a well-balanced diet.  DRESSING / WOUND CARE / SHOWERING  Keep the surgical dressing until follow up.  The dressing is water proof, so you can shower without any extra covering.  IF THE DRESSING FALLS OFF or the wound gets wet inside, change the dressing with sterile gauze.  Please use good hand washing techniques before changing the dressing.  Do not use any lotions or creams on the incision until instructed by your surgeon.    ACTIVITY  o Increase activity slowly as tolerated, but follow the weight bearing instructions  below.   o No driving for 6 weeks or until further direction given by your physician.  You cannot drive while taking narcotics.  o No lifting or carrying greater than 10 lbs. until further directed by your surgeon. o Avoid periods of inactivity such as sitting longer than an hour when not asleep. This helps prevent blood clots.  o You may return to work once you are authorized by your doctor.     WEIGHT BEARING   Weight bearing as tolerated with assist device (walker, cane, etc) as directed, use it as long as suggested by your surgeon or therapist, typically at least 4-6 weeks.   EXERCISES  Results after joint replacement surgery are often greatly improved when you follow the exercise, range of motion and muscle strengthening exercises prescribed by your doctor. Safety measures are also important to protect the joint from further injury. Any time any of these exercises cause you to have increased pain or swelling, decrease what you are doing until you are comfortable again and then slowly increase them. If you have problems or questions, call your caregiver or physical therapist for advice.   Rehabilitation is important following a joint replacement. After just a few days of immobilization, the muscles of the leg can become weakened and shrink (atrophy).  These exercises are designed to build up the tone and strength of the thigh and leg muscles and to improve motion. Often times heat used for twenty to thirty minutes before working out will loosen up your tissues and help with improving the range of motion but do not  use heat for the first two weeks following surgery (sometimes heat can increase post-operative swelling).   These exercises can be done on a training (exercise) mat, on the floor, on a table or on a bed. Use whatever works the best and is most comfortable for you.    Use music or television while you are exercising so that the exercises are a pleasant break in your day. This will  make your life better with the exercises acting as a break in your routine that you can look forward to.   Perform all exercises about fifteen times, three times per day or as directed.  You should exercise both the operative leg and the other leg as well.  Exercises include:    Quad Sets - Tighten up the muscle on the front of the thigh (Quad) and hold for 5-10 seconds.    Straight Leg Raises - With your knee straight (if you were given a brace, keep it on), lift the leg to 60 degrees, hold for 3 seconds, and slowly lower the leg.  Perform this exercise against resistance later as your leg gets stronger.   Leg Slides: Lying on your back, slowly slide your foot toward your buttocks, bending your knee up off the floor (only go as far as is comfortable). Then slowly slide your foot back down until your leg is flat on the floor again.   Angel Wings: Lying on your back spread your legs to the side as far apart as you can without causing discomfort.   Hamstring Strength:  Lying on your back, push your heel against the floor with your leg straight by tightening up the muscles of your buttocks.  Repeat, but this time bend your knee to a comfortable angle, and push your heel against the floor.  You may put a pillow under the heel to make it more comfortable if necessary.   A rehabilitation program following joint replacement surgery can speed recovery and prevent re-injury in the future due to weakened muscles. Contact your doctor or a physical therapist for more information on knee rehabilitation.    CONSTIPATION  Constipation is defined medically as fewer than three stools per week and severe constipation as less than one stool per week.  Even if you have a regular bowel pattern at home, your normal regimen is likely to be disrupted due to multiple reasons following surgery.  Combination of anesthesia, postoperative narcotics, change in appetite and fluid intake all can affect your bowels.   YOU MUST  use at least one of the following options; they are listed in order of increasing strength to get the job done.  They are all available over the counter, and you may need to use some, POSSIBLY even all of these options:    Drink plenty of fluids (prune juice may be helpful) and high fiber foods Colace 100 mg by mouth twice a day  Senokot for constipation as directed and as needed Dulcolax (bisacodyl), take with full glass of water  Miralax (polyethylene glycol) once or twice a day as needed.  If you have tried all these things and are unable to have a bowel movement in the first 3-4 days after surgery call either your surgeon or your primary doctor.    If you experience loose stools or diarrhea, hold the medications until you stool forms back up.  If your symptoms do not get better within 1 week or if they get worse, check with your doctor.  If you experience "  the worst abdominal pain ever" or develop nausea or vomiting, please contact the office immediately for further recommendations for treatment.   ITCHING:  If you experience itching with your medications, try taking only a single pain pill, or even half a pain pill at a time.  You can also use Benadryl over the counter for itching or also to help with sleep.   TED HOSE STOCKINGS:  Use stockings on both legs until for at least 2 weeks or as directed by physician office. They may be removed at night for sleeping.  MEDICATIONS:  See your medication summary on the After Visit Summary that nursing will review with you.  You may have some home medications which will be placed on hold until you complete the course of blood thinner medication.  It is important for you to complete the blood thinner medication as prescribed.  PRECAUTIONS:  If you experience chest pain or shortness of breath - call 911 immediately for transfer to the hospital emergency department.   If you develop a fever greater that 101 F, purulent drainage from wound, increased  redness or drainage from wound, foul odor from the wound/dressing, or calf pain - CONTACT YOUR SURGEON.                                                   FOLLOW-UP APPOINTMENTS:  If you do not already have a post-op appointment, please call the office for an appointment to be seen by your surgeon.  Guidelines for how soon to be seen are listed in your After Visit Summary, but are typically between 1-4 weeks after surgery.  OTHER INSTRUCTIONS:   Knee Replacement:  Do not place pillow under knee, focus on keeping the knee straight while resting.   MAKE SURE YOU:   Understand these instructions.   Get help right away if you are not doing well or get worse.    Thank you for letting us be a part of your medical care team.  It is a privilege we respect greatly.  We hope these instructions will help you stay on track for a fast and full recovery!

## 2019-03-03 NOTE — Progress Notes (Signed)
Initial Nutrition Assessment  DOCUMENTATION CODES:   Not applicable  INTERVENTION:    Ensure MAX Protein po once daily, each supplement provides 150 kcal and 30 grams of protein  MVI daily  NUTRITION DIAGNOSIS:   Increased nutrient needs related to post-op healing as evidenced by estimated needs.  GOAL:   Patient will meet greater than or equal to 90% of their needs  MONITOR:   PO intake, Supplement acceptance, Diet advancement, Weight trends, I & O's, Skin, Labs  REASON FOR ASSESSMENT:   Consult Hip fracture protocol  ASSESSMENT:   Patient with PMH significant for hypothyroidism. Presents this admission after a mechanical fall resulting in right hip fracture.    3/31- right total hip replacement  RD working remotely.  Pt unable to answer RD questions as she just got out of surgery. Per Ortho note, pt has lost 40 lb intentionally by walking her dog and not eating out as frequently. She is currently NPO after procedure. RD to provide Ensure MAX once diet is advanced to maximize calories and protein this admission.   Records are limited in weight history. Unable to complete Nutrition-Focused physical exam at this time.   Medications reviewed and include: Vit D, colace, ferrous sulfate, Vit C Labs reviewed.   Diet Order:   Diet Order    None      EDUCATION NEEDS:   Not appropriate for education at this time  Skin:  Skin Assessment: Skin Integrity Issues: Skin Integrity Issues:: Incisions Incisions: right hip/right arm  Last BM:  PTA  Height:   Ht Readings from Last 1 Encounters:  03/02/19 5\' 4"  (1.626 m)    Weight:   Wt Readings from Last 1 Encounters:  03/02/19 72.6 kg    Ideal Body Weight:  54.5 kg  BMI:  Body mass index is 27.46 kg/m.  Estimated Nutritional Needs:   Kcal:  1650-1850 kcal  Protein:  80-95 grams  Fluid:  >/= 1.6 L/day   Vanessa Kick RD, LDN Clinical Nutrition Pager # - [phone]

## 2019-03-03 NOTE — Consult Note (Signed)
Reason for Consult: Displaced right femoral neck fracture Referring Physician:  Myrene Galas, MD  Rebecca Sims is an 63 y.o. female.  HPI: Very pleasant 63 yo female was pulled down while walking her [place] shepherd yesterday, 3/20. 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 Skinned right elbow in the fall but denies any other complaints or LOC. Right hip was in a flexed position but now more comfortable semi-extended position.  Seen this am.  Seemingly comfortable at this time.  No issues since admission  Past Medical History:  Diagnosis Date  . Hypothyroidism     History reviewed. No pertinent surgical history.  History reviewed. No pertinent family history.  Social History:  reports that she has never smoked. She has never used smokeless tobacco. She reports that she does not drink alcohol or use drugs.  Allergies: No Known Allergies  Medications:  I have reviewed the patient's current medications. Scheduled: . acetaminophen  1,000 mg Oral Once  . [MAR Hold] cholecalciferol  2,000 Units Oral BID  . [MAR Hold] levothyroxine  88 mcg Oral Q0600  . [MAR Hold] loratadine  10 mg Oral QHS  . [MAR Hold] vitamin C  500 mg Oral Daily    Results for orders placed or performed during the hospital encounter of 03/02/19 (from the past 24 hour(s))  Basic metabolic panel     Status: Abnormal   Collection Time: 03/02/19  5:03 PM  Result Value Ref Range   Sodium 139 135 - 145 mmol/L   Potassium 3.4 (L) 3.5 - 5.1 mmol/L   Chloride 105 98 - 111 mmol/L   CO2 26 22 - 32 mmol/L   Glucose, Bld 113 (H) 70 - 99 mg/dL   BUN 15 8 - 23 mg/dL   Creatinine, Ser 0.01 0.44 - 1.00 mg/dL   Calcium 9.3 8.9 - 74.9 mg/dL   GFR calc non Af Amer >60 >60 mL/min   GFR calc Af Amer >60 >60 mL/min   Anion gap 8 5 - 15  CBC WITH DIFFERENTIAL      Status: Abnormal   Collection Time: 03/02/19  5:03 PM  Result Value Ref Range   WBC 11.6 (H) 4.0 - 10.5 K/uL   RBC 4.41 3.87 - 5.11 MIL/uL   Hemoglobin 14.6 12.0 - 15.0 g/dL   HCT 44.9 67.5 - 91.6 %   MCV 100.5 (H) 80.0 - 100.0 fL   MCH 33.1 26.0 - 34.0 pg   MCHC 33.0 30.0 - 36.0 g/dL   RDW 38.4 66.5 - 99.3 %   Platelets 245 150 - 400 K/uL   nRBC 0.0 0.0 - 0.2 %   Neutrophils Relative % 86 %   Neutro Abs 9.9 (H) 1.7 - 7.7 K/uL   Lymphocytes Relative 9 %   Lymphs Abs 1.0 0.7 - 4.0 K/uL   Monocytes Relative 4 %   Monocytes Absolute 0.5 0.1 - 1.0 K/uL   Eosinophils Relative 0 %   Eosinophils Absolute 0.0 0.0 - 0.5 K/uL   Basophils Relative 0 %   Basophils Absolute 0.0 0.0 - 0.1 K/uL   Immature Granulocytes 1 %   Abs Immature Granulocytes 0.07 0.00 - 0.07 K/uL  Protime-INR     Status: None   Collection Time: 03/02/19  5:03 PM  Result Value Ref Range   Prothrombin Time 12.8 11.4 -  15.2 seconds   INR 1.0 0.8 - 1.2  Type and screen [facility]     Status: None   Collection Time: 03/02/19  5:03 PM  Result Value Ref Range   ABO/RH(D) A POS    Antibody Screen NEG    Sample Expiration      03/05/2019 Performed at [facility], [address], [place], [place] [address]   ABO/Rh     Status: None   Collection Time: 03/02/19  5:03 PM  Result Value Ref Range   ABO/RH(D)      A POS Performed at [facility], [address] [place], [place] [address]   MRSA PCR Screening     Status: None   Collection Time: 03/02/19 10:54 PM  Result Value Ref Range   MRSA by PCR NEGATIVE NEGATIVE  CBC     Status: None   Collection Time: 03/03/19  2:47 AM  Result Value Ref Range   WBC 6.9 4.0 - 10.5 K/uL   RBC 4.19 3.87 - 5.11 MIL/uL   Hemoglobin 13.9 12.0 - 15.0 g/dL   HCT 09.8 11.9 - 14.7 %   MCV 99.3 80.0 - 100.0 fL   MCH 33.2 26.0 - 34.0 pg   MCHC 33.4 30.0 - 36.0 g/dL   RDW 82.9 56.2 - 13.0 %   Platelets 243 150 - 400 K/uL   nRBC 0.0 0.0 - 0.2 %   Magnesium     Status: None   Collection Time: 03/03/19  2:47 AM  Result Value Ref Range   Magnesium 2.2 1.7 - 2.4 mg/dL  Prealbumin     Status: None   Collection Time: 03/03/19  2:47 AM  Result Value Ref Range   Prealbumin 23.3 18 - 38 mg/dL  TSH     Status: None   Collection Time: 03/03/19  2:47 AM  Result Value Ref Range   TSH 1.151 0.350 - 4.500 uIU/mL  Comprehensive metabolic panel     Status: Abnormal   Collection Time: 03/03/19  2:47 AM  Result Value Ref Range   Sodium 139 135 - 145 mmol/L   Potassium 3.9 3.5 - 5.1 mmol/L   Chloride 105 98 - 111 mmol/L   CO2 25 22 - 32 mmol/L   Glucose, Bld 104 (H) 70 - 99 mg/dL   BUN 14 8 - 23 mg/dL   Creatinine, Ser 8.65 0.44 - 1.00 mg/dL   Calcium 8.9 8.9 - 78.4 mg/dL   Total Protein 6.0 (L) 6.5 - 8.1 g/dL   Albumin 3.8 3.5 - 5.0 g/dL   AST 27 15 - 41 U/L   ALT 21 0 - 44 U/L   Alkaline Phosphatase 56 38 - 126 U/L   Total Bilirubin 1.2 0.3 - 1.2 mg/dL   GFR calc non Af Amer >60 >60 mL/min   GFR calc Af Amer >60 >60 mL/min   Anion gap 9 5 - 15  APTT     Status: None   Collection Time: 03/03/19  2:47 AM  Result Value Ref Range   aPTT 28 24 - 36 seconds  Protime-INR     Status: None   Collection Time: 03/03/19  2:47 AM  Result Value Ref Range   Prothrombin Time 13.5 11.4 - 15.2 seconds   INR 1.0 0.8 - 1.2    X-ray: CLINICAL DATA:  Right hip pain after fall today.  EXAM: DG HIP (WITH OR WITHOUT PELVIS) 2-3V RIGHT  COMPARISON:  None.  FINDINGS: Examination  demonstrates a minimally displaced subcapital fracture of the right femoral neck. Mild fecal retention over the rectum. Remaining bony and soft tissue structures are unremarkable.  IMPRESSION: Minimally displaced subcapital right femoral neck fracture.   Electronically Signed   By: Elberta Fortis M.D.  ROS  No fever, chills, GI, GU complaints, MSK as above.  Blood pressure 112/60, pulse 66, temperature 98.6 F (37 C), temperature source Oral, resp. rate  16, height 5\' 4"  (1.626 m), weight 72.6 kg, SpO2 99 %.  Physical Exam Awake alert Right arm dressing with scant drainage from abrasion  Right LE slightly shortened and held in ER position NVI  No LUE or LLE exam findings, WNL General medical exam reviewed for pertinents  Assessment/Plan: Displaced right femoral neck fracture  To OR this am for Right total hip replacement Reviewed risks of infection, DVT, dislocation, neurovascular injury and need for future surgeries.  Pros and cons of treatment options were reviewed following discussion with Dr. Carola Frost Questions encouraged answered and reviewed NPO Ancef pre-operatively Discussed post-op course and expectations WBAT RLE post-op In hospital for couple days to work on home safe discharge  Shelda Pal 03/03/2019, 9:53 AM

## 2019-03-03 NOTE — Plan of Care (Signed)

## 2019-03-03 NOTE — Plan of Care (Signed)
  Problem: Pain Managment: Goal: General experience of comfort will improve Outcome: Progressing   Problem: Safety: Goal: Ability to remain free from injury will improve Outcome: Progressing   

## 2019-03-03 NOTE — Transfer of Care (Addendum)
Immediate Anesthesia Transfer of Care Note  Patient: Rebecca Sims  Procedure(s) Performed: TOTAL HIP ARTHROPLASTY ANTERIOR APPROACH (Right )  Patient Location: PACU  Anesthesia Type:MAC and Spinal  Level of Consciousness: awake, alert , oriented and patient cooperative  Airway & Oxygen Therapy: Patient Spontanous Breathing and Patient connected to nasal cannula oxygen  Post-op Assessment: Report given to RN and Post -op Vital signs reviewed and stable  Post vital signs: Reviewed and stable  Last Vitals:  Vitals Value Taken Time  BP    Temp    Pulse    Resp    SpO2      Last Pain:  Vitals:   03/03/19 1327  TempSrc:   PainSc: 8       Patients Stated Pain Goal: 0 (40/76/80 8811)  Complications: No apparent anesthesia complications

## 2019-03-03 NOTE — Progress Notes (Signed)
Report called to nurse in short stay. Consent on chart and pre-procedural checklist completed.

## 2019-03-03 NOTE — Anesthesia Preprocedure Evaluation (Addendum)
Anesthesia Evaluation  Patient identified by MRN, date of birth, ID band Patient awake    Reviewed: Allergy & Precautions, NPO status , Patient's Chart, lab work & pertinent test results  Airway Mallampati: II  TM Distance: >3 FB Neck ROM: Full    Dental  (+) Teeth Intact, Dental Advisory Given   Pulmonary neg pulmonary ROS,    Pulmonary exam normal breath sounds clear to auscultation       Cardiovascular Normal cardiovascular exam+ dysrhythmias (LBBB)  Rhythm:Regular Rate:Normal     Neuro/Psych negative neurological ROS  negative psych ROS   GI/Hepatic negative GI ROS, Neg liver ROS,   Endo/Other  Hypothyroidism   Renal/GU negative Renal ROS     Musculoskeletal Right Femoral Neck Fracture   Abdominal   Peds  Hematology negative hematology ROS (+) Plt 243k   Anesthesia Other Findings Day of surgery medications reviewed with the patient.  Reproductive/Obstetrics                             Anesthesia Physical Anesthesia Plan  ASA: II  Anesthesia Plan: Spinal   Post-op Pain Management:    Induction: Intravenous  PONV Risk Score and Plan: 2 and Propofol infusion and Treatment may vary due to age or medical condition  Airway Management Planned: Natural Airway and Nasal Cannula  Additional Equipment:   Intra-op Plan:   Post-operative Plan:   Informed Consent: I have reviewed the patients History and Physical, chart, labs and discussed the procedure including the risks, benefits and alternatives for the proposed anesthesia with the patient or authorized representative who has indicated his/her understanding and acceptance.     Dental advisory given  Plan Discussed with: CRNA, Anesthesiologist and Surgeon  Anesthesia Plan Comments: (Ketamine, propofol to sedate prior to placing in right lateral decubitus for spinal.)        Anesthesia Quick Evaluation

## 2019-03-03 NOTE — Progress Notes (Signed)
[facility]                                                                              Patient Demographics  Rebecca Sims, is a 64 y.o. female, DOB - 07/21/1956, EPP:[id number]  Admit date - 03/02/2019   Admitting Physician Briscoe Deutscher, MD  Outpatient Primary MD for the patient is Patient, No Pcp Per  Outpatient specialists:   LOS - 1  days   Medical records reviewed and are as summarized below:    Chief Complaint  Patient presents with  . Fall       Brief summary   Patient is a 63 year old female with history of seasonal allergies, hypothyroidism presented to ED for right hip pain after mechanical fall.  Patient was walking her dog when the dog lunged and knocked her to the ground.  She fell onto her right side without hitting her head or losing consciousness.  Otherwise physically active, walking her dog daily.  Imaging showed minimally displaced subcapsular right femoral neck fracture.  Orthopedics consulted.   Assessment & Plan    Principal Problem:   Closed fracture of right hip (HCC) -After mechanical fall, x-rays showed minimally displaced subcapital right femoral neck fracture -Orthopedics consulted, n.p.o. for surgery today -Start PT tomorrow, likely will be able to go home with home health PT given active functional status -Continue pain control  Active Problems:   Hypokalemia Resolved    LBBB (left bundle branch block) -Incidentally noted on admission EKG, no prior history of heart disease, no cardiac symptoms. -No further testing needed  Hypothyroidism TSH 1.5 Thyroid 88 MCG daily  Code Status: Full code DVT Prophylaxis:   SCD's Family Communication: Discussed in detail with the patient, all imaging results, lab results explained to the patient    Disposition Plan: Plan disposition after PT eval  Time Spent in minutes 25 minutes  Procedures:  None  Consultants:   Orthopedics  Antimicrobials:   Anti-infectives  (From admission, onward)   None          Medications  Scheduled Meds: . [MAR Hold] cholecalciferol  2,000 Units Oral BID  . [MAR Hold] levothyroxine  88 mcg Oral Q0600  . [MAR Hold] loratadine  10 mg Oral QHS  . [MAR Hold] vitamin C  500 mg Oral Daily   Continuous Infusions: . [MAR Hold] methocarbamol (ROBAXIN) IV     PRN Meds:.[MAR Hold] methocarbamol **OR** [MAR Hold] methocarbamol (ROBAXIN) IV, [MAR Hold]  morphine injection, [MAR Hold] polyethylene glycol      Subjective:   Rebecca Sims was seen and examined today.  Pain on the right hip on moving otherwise no acute issues. Patient denies dizziness, chest pain, shortness of breath, abdominal pain, N/V/D/C, new weakness, numbess, tingling. No acute events overnight.    Objective:   Vitals:   03/02/19 1603 03/02/19 1918 03/02/19 2058 03/03/19 0248  BP:  117/89 (!) 133/55 112/60  Pulse:   78 66  Resp:  16 16 16   Temp:  99.3 F (37.4 C) 99 F (37.2 C) 98.6 F (37 C)  TempSrc:  Oral Oral Oral  SpO2:  99% 98% 99%  Weight: 72.6 kg     Height: 5\' 4"  (1.626 m)       Intake/Output Summary (Last 24 hours) at 03/03/2019 8032 Last data filed at 03/03/2019 0300 Gross per 24 hour  Intake -  Output 200 ml  Net -200 ml     Wt Readings from Last 3 Encounters:  03/02/19 72.6 kg     Exam  General: Alert and oriented x 3, NAD  Eyes:   HEENT:  Atraumatic, normocephalic, normal oropharynx  Cardiovascular: S1 S2 auscultated, Regular rate and rhythm.  Respiratory: Clear to auscultation bilaterally, no wheezing, rales or rhonchi  Gastrointestinal: Soft, nontender, nondistended, + bowel sounds  Ext: no pedal edema bilaterally  Neuro: strength 5/5 in upper extremities.  LE deferred due to pain  Musculoskeletal: No digital cyanosis, clubbing  Skin: No rashes  Psych: Normal affect and demeanor, alert and oriented x3    Data Reviewed:  I have personally reviewed following labs and imaging studies  Micro  Results Recent Results (from the past 240 hour(s))  MRSA PCR Screening     Status: None   Collection Time: 03/02/19 10:54 PM  Result Value Ref Range Status   MRSA by PCR NEGATIVE NEGATIVE Final    Comment:        The GeneXpert MRSA Assay (FDA approved for NASAL specimens only), is one component of a comprehensive MRSA colonization surveillance program. It is not intended to diagnose MRSA infection nor to guide or monitor treatment for MRSA infections. Performed at [facility], [address], [place], [place] [address]     Radiology Reports Chest Portable 1 View  Result Date: 03/02/2019 CLINICAL DATA:  Preop chest x-ray for hip surgery. EXAM: PORTABLE CHEST 1 VIEW COMPARISON:  09/11/2005 FINDINGS: Lungs are clear. Cardiomediastinal silhouette and remainder of the exam is unchanged. IMPRESSION: No active disease. Electronically Signed   By: Elberta Fortis M.D.   On: 03/02/2019 19:37   Dg Hand Complete Left  Result Date: 03/02/2019 CLINICAL DATA:  Left hand pain after fall today. EXAM: LEFT HAND - COMPLETE 3+ VIEW COMPARISON:  None. FINDINGS: Mild degenerative changes over the radiocarpal joint. No acute fracture or dislocation. IMPRESSION: No acute findings. Electronically Signed   By: Elberta Fortis M.D.   On: 03/02/2019 16:56   Dg Hip Unilat With Pelvis 2-3 Views Right  Result Date: 03/02/2019 CLINICAL DATA:  Right hip pain after fall today. EXAM: DG HIP (WITH OR WITHOUT PELVIS) 2-3V RIGHT COMPARISON:  None. FINDINGS: Examination demonstrates a minimally displaced subcapital fracture of the right femoral neck. Mild fecal retention over the rectum. Remaining bony and soft tissue structures are unremarkable. IMPRESSION: Minimally displaced subcapital right femoral neck fracture. Electronically Signed   By: Elberta Fortis M.D.   On: 03/02/2019 16:55    Lab Data:  CBC: Recent Labs  Lab 03/02/19 1703 03/03/19 0247  WBC 11.6* 6.9  NEUTROABS 9.9*  --   HGB 14.6 13.9  HCT  44.3 41.6  MCV 100.5* 99.3  PLT 245 243   Basic Metabolic Panel: Recent Labs  Lab 03/02/19 1703 03/03/19 0247  NA 139 139  K 3.4* 3.9  CL 105 105  CO2 26 25  GLUCOSE 113* 104*  BUN 15 14  CREATININE 0.84 0.62  CALCIUM 9.3 8.9  MG  --  2.2   GFR: Estimated Creatinine Clearance: 72.2 mL/min (by C-G formula based on SCr of 0.62 mg/dL). Liver Function Tests: Recent Labs  Lab 03/03/19 0247  AST 27  ALT 21  ALKPHOS 56  BILITOT 1.2  PROT 6.0*  ALBUMIN 3.8   No results for input(s): LIPASE, AMYLASE in the last 168 hours. No results for input(s): AMMONIA in the last 168 hours. Coagulation Profile: Recent Labs  Lab 03/02/19 1703 03/03/19 0247  INR 1.0 1.0   Cardiac Enzymes: No results for input(s): CKTOTAL, CKMB, CKMBINDEX, TROPONINI in the last 168 hours. BNP (last 3 results) No results for input(s): PROBNP in the last 8760 hours. HbA1C: No results for input(s): HGBA1C in the last 72 hours. CBG: No results for input(s): GLUCAP in the last 168 hours. Lipid Profile: No results for input(s): CHOL, HDL, LDLCALC, TRIG, CHOLHDL, LDLDIRECT in the last 72 hours. Thyroid Function Tests: Recent Labs    03/03/19 0247  TSH 1.151   Anemia Panel: No results for input(s): VITAMINB12, FOLATE, FERRITIN, TIBC, IRON, RETICCTPCT in the last 72 hours. Urine analysis: No results found for: COLORURINE, APPEARANCEUR, LABSPEC, PHURINE, GLUCOSEU, HGBUR, BILIRUBINUR, KETONESUR, PROTEINUR, UROBILINOGEN, NITRITE, LEUKOCYTESUR   Ripudeep Rai M.D. [facility] 03/03/2019, 9:37 AM  Pager: [phone] Between 7am to 7pm - call Pager - [phone]  After 7pm go to [email or web address] - password [email or web address]  Call night coverage person covering after 7pm

## 2019-03-04 ENCOUNTER — Encounter (HOSPITAL_COMMUNITY): Payer: Self-pay | Admitting: Orthopedic Surgery

## 2019-03-04 LAB — PTH, INTACT AND CALCIUM
CALCIUM TOTAL (PTH): 9 mg/dL (ref 8.7–10.3)
PTH: 13 pg/mL — ABNORMAL LOW (ref 15–65)

## 2019-03-04 LAB — CALCITRIOL (1,25 DI-OH VIT D): Vit D, 1,25-Dihydroxy: 48.2 pg/mL (ref 19.9–79.3)

## 2019-03-04 LAB — BASIC METABOLIC PANEL
Anion gap: 8 (ref 5–15)
BUN: 11 mg/dL (ref 8–23)
CO2: 23 mmol/L (ref 22–32)
CREATININE: 0.57 mg/dL (ref 0.44–1.00)
Calcium: 8.3 mg/dL — ABNORMAL LOW (ref 8.9–10.3)
Chloride: 104 mmol/L (ref 98–111)
GFR calc Af Amer: >60 mL/min (ref >60)
GFR calc non Af Amer: >60 mL/min (ref >60)
Glucose, Bld: 136 mg/dL — ABNORMAL HIGH (ref 70–99)
Potassium: 3.5 mmol/L (ref 3.5–5.1)
SODIUM: 135 mmol/L (ref 135–145)

## 2019-03-04 LAB — CBC
HCT: 36.5 % (ref 36.0–46.0)
Hemoglobin: 12.5 g/dL (ref 12.0–15.0)
MCH: 34.1 pg — ABNORMAL HIGH (ref 26.0–34.0)
MCHC: 34.2 g/dL (ref 30.0–36.0)
MCV: 99.5 fL (ref 80.0–100.0)
Platelets: 165 10*3/uL (ref 150–400)
RBC: 3.67 MIL/uL — ABNORMAL LOW (ref 3.87–5.11)
RDW: 12.1 % (ref 11.5–15.5)
WBC: 6.9 10*3/uL (ref 4.0–10.5)
nRBC: 0 % (ref 0.0–0.2)

## 2019-03-04 LAB — VITAMIN D 25 HYDROXY (VIT D DEFICIENCY, FRACTURES): Vit D, 25-Hydroxy: 39.4 ng/mL (ref 30.0–100.0)

## 2019-03-04 MED ORDER — DOCUSATE SODIUM 100 MG PO CAPS: 100.0000 mg | ORAL_CAPSULE | Freq: Two times a day (BID) | ORAL | 0 refills | Status: DC

## 2019-03-04 MED ORDER — HYDROCODONE-ACETAMINOPHEN 7.5-325 MG PO TABS: 1.0000 | ORAL_TABLET | ORAL | 0 refills | Status: DC | PRN

## 2019-03-04 MED ORDER — FERROUS SULFATE 325 (65 FE) MG PO TABS: 325.0000 mg | ORAL_TABLET | Freq: Three times a day (TID) | ORAL | 3 refills | Status: DC

## 2019-03-04 MED ORDER — POLYETHYLENE GLYCOL 3350 17 G PO PACK: 17.0000 g | PACK | Freq: Two times a day (BID) | ORAL | 0 refills | Status: DC

## 2019-03-04 MED ORDER — METHOCARBAMOL 500 MG PO TABS: 500.0000 mg | ORAL_TABLET | Freq: Four times a day (QID) | ORAL | 0 refills | Status: DC | PRN

## 2019-03-04 MED ORDER — SENNOSIDES-DOCUSATE SODIUM 8.6-50 MG PO TABS
2.0000 | ORAL_TABLET | Freq: Two times a day (BID) | ORAL | Status: DC
  Administered 2019-03-04 – 2019-03-05 (×2): 2 via ORAL
  Filled 2019-03-04 (×2): qty 2

## 2019-03-04 MED ORDER — ASPIRIN 81 MG PO CHEW: 81.0000 mg | CHEWABLE_TABLET | Freq: Two times a day (BID) | ORAL | 0 refills | Status: AC

## 2019-03-04 NOTE — Evaluation (Signed)
Physical Therapy Evaluation Patient Details Name: Rebecca Sims MRN: [id number] DOB: 12-16-1955 Today's Date: 03/04/2019   History of Present Illness  POD 1 R THA anterior approach, WBAT after a fall. (3/31)    Clinical Impression  Pt admitted with above diagnosis. Pt currently with functional limitations due to the deficits listed below (see PT Problem List). PTA, pt independent living alone reports she will be coming home to her daughters after d/c. Today, limited by pain and hypotension and nausea ambulating in room. Min A for transfers, contact guard for gait 20'. Feel she will progress well to HHPT in 1-2 more PT visits.  Pt will benefit from skilled PT to increase their independence and safety with mobility to allow discharge to the venue listed below.       Follow Up Recommendations Home health PT    Equipment Recommendations  Rolling walker with 5" wheels    Recommendations for Other Services OT consult     Precautions / Restrictions Restrictions Weight Bearing Restrictions: Yes RLE Weight Bearing: Weight bearing as tolerated      Mobility  Bed Mobility Overal bed mobility: Needs Assistance Bed Mobility: Supine to Sit     Supine to sit: Min assist        Transfers Overall transfer level: Needs assistance   Transfers: Sit to/from Stand Sit to Stand: Min assist         General transfer comment: min A to stand  Ambulation/Gait Ambulation/Gait assistance: Min assist;Min guard Gait Distance (Feet): 30 Feet Assistive device: Rolling walker (2 wheeled) Gait Pattern/deviations: Step-to pattern Gait velocity: decreased   General Gait Details: step to, antlagic, partial weight bearing at this time. cues for proper use of RW and to avoid extreme motions at the hip  Stairs            Wheelchair Mobility    Modified Rankin (Stroke Patients Only)       Balance Overall balance assessment: Needs assistance   Sitting balance-Leahy Scale: Fair        Standing balance-Leahy Scale: Poor                               Pertinent Vitals/Pain Pain Assessment: Faces Faces Pain Scale: Hurts even more Pain Location: R hip Pain Descriptors / Indicators: Aching Pain Intervention(s): Limited activity within patient's tolerance;Monitored during session    Home Living Family/patient expects to be discharged to:: Private residence Living Arrangements: Children;Alone(will be staying at her daughters) Available Help at Discharge: Family Type of Home: House Home Access: Level entry     Home Layout: One level Home Equipment: None      Prior Function Level of Independence: Independent               Hand Dominance        Extremity/Trunk Assessment   Upper Extremity Assessment Upper Extremity Assessment: Overall WFL for tasks assessed    Lower Extremity Assessment Lower Extremity Assessment: Overall WFL for tasks assessed(post op pain and weakness R side)       Communication   Communication: No difficulties  Cognition Arousal/Alertness: Awake/alert Behavior During Therapy: WFL for tasks assessed/performed Overall Cognitive Status: Within Functional Limits for tasks assessed                                        General Comments  Exercises     Assessment/Plan    PT Assessment Patient needs continued PT services  PT Problem List Decreased strength       PT Treatment Interventions DME instruction;Gait training;Stair training;Functional mobility training;Therapeutic activities;Therapeutic exercise;Balance training    PT Goals (Current goals can be found in the Care Plan section)  Acute Rehab PT Goals Patient Stated Goal: go home PT Goal Formulation: With patient Time For Goal Achievement: 03/18/19 Potential to Achieve Goals: Good    Frequency Min 3X/week   Barriers to discharge        Co-evaluation               AM-PAC PT "6 Clicks" Mobility  Outcome Measure  Help needed turning from your back to your side while in a flat bed without using bedrails?: None Help needed moving from lying on your back to sitting on the side of a flat bed without using bedrails?: A Little Help needed moving to and from a bed to a chair (including a wheelchair)?: A Little Help needed standing up from a chair using your arms (e.g., wheelchair or bedside chair)?: A Little Help needed to walk in hospital room?: A Little Help needed climbing 3-5 steps with a railing? : A Lot 6 Click Score: 18    End of Session Equipment Utilized During Treatment: Gait belt Activity Tolerance: Patient tolerated treatment well Patient left: in bed;with call bell/phone within reach Nurse Communication: Mobility status PT Visit Diagnosis: Unsteadiness on feet (R26.81)    Time: 7169-6789 PT Time Calculation (min) (ACUTE ONLY): 31 min   Charges:   PT Evaluation $PT Eval Low Complexity: 1 Low PT Treatments $Gait Training: 8-22 mins        Etta Grandchild, PT, DPT Acute Rehabilitation Services Pager: [phone] Office: [phone]    Etta Grandchild 03/04/2019, 1:23 PM

## 2019-03-04 NOTE — Progress Notes (Signed)
Orthopedic Tech Progress Note Patient Details:  Rebecca Sims [birth year] [id number] PUT UP overhead frame and trapeze Patient ID: [name], female   DOB: Apr 11, 1956, 63 y.o.   MRN: [id number]   [name] 03/04/2019, 3:51 PM

## 2019-03-04 NOTE — TOC Initial Note (Signed)
Transition of Care [facility]) - Initial/Assessment Note    Patient Details  Name: Rebecca Sims MRN: [id number] Date of Birth: January 29, 1956  [facility] CM/SW Contact:    Durenda Guthrie, RN Phone Number: 03/04/2019, 3:45 PM  Clinical Narrative:   63 yr old female underwent a right total hip arthroplasty after a fall 03/03/19. Case manager spoke with patient concerning discharge plan and DME. Patient has no knowledge of HH agencies, gave permission for CM to arrange. Referral called to Shon Millet, [facility]. Patient will have support at discharge.               Expected Discharge Plan: Home w Home Health Services Barriers to Discharge: No Barriers Identified   Patient Goals and CMS Choice     Choice offered to / list presented to : Patient  Expected Discharge Plan and Services Expected Discharge Plan: Home w Home Health Services   Discharge Planning Services: CM Consult   Living arrangements for the past 2 months: Single Family Home                 DME Arranged: Walker rolling DME Agency: [facility] HH Arranged: PT HH Agency: [facility]  Prior Living Arrangements/Services Living arrangements for the past 2 months: Single Family Home Lives with:: Self   Do you feel safe going back to the place where you live?: Yes        Care giver support system in place?: Yes (comment)   Criminal Activity/Legal Involvement Pertinent to Current Situation/Hospitalization: No - Comment as needed  Activities of Daily Living Home Assistive Devices/Equipment: Eyeglasses ADL Screening (condition at time of admission) Patient's cognitive ability adequate to safely complete daily activities?: Yes Is the patient deaf or have difficulty hearing?: No Does the patient have difficulty seeing, even when wearing glasses/contacts?: No Does the patient have difficulty concentrating, remembering, or making decisions?: No Patient able to express need  for assistance with ADLs?: Yes Does the patient have difficulty dressing or bathing?: Yes Independently performs ADLs?: Yes (appropriate for developmental age) Does the patient have difficulty walking or climbing stairs?: Yes Weakness of Legs: Both Weakness of Arms/Hands: None  Permission Sought/Granted Permission sought to share information with : Case Manager                Emotional Assessment Appearance:: Appears stated age Attitude/Demeanor/Rapport: Gracious Affect (typically observed): Accepting Orientation: : Oriented to Self, Oriented to Situation, Oriented to Place, Oriented to  Time Alcohol / Substance Use: Not Applicable    Admission diagnosis:  Preoperative testing [Z01.818] Closed fracture of right hip, initial encounter (HCC) [S72.001A] Closed right hip fracture, initial encounter [facility]) [S72.001A] Patient Active Problem List   Diagnosis Date Noted  . Closed fracture of right hip (HCC) 03/02/2019  . Hypokalemia 03/02/2019  . LBBB (left bundle branch block) 03/02/2019  . Preoperative testing   . Hypothyroidism    PCP:  Patient, No Pcp Per Pharmacy:   [id number] [place], [place] - [address] [place] [address] Phone: [phone] Fax: [phone]     Social Determinants of Health (SDOH) Interventions    Readmission Risk Interventions No flowsheet data found.

## 2019-03-04 NOTE — Progress Notes (Signed)
Patient ID: Rebecca Sims, female   DOB: 1955-12-30, 63 y.o.   MRN: [id number] Subjective: 1 Day Post-Op Procedure(s) (LRB): TOTAL HIP ARTHROPLASTY ANTERIOR APPROACH (Right)    Patient reports pain as mild.  Soreness.  Not much activity yesterday  Objective:   VITALS:   Vitals:   03/03/19 2327 03/04/19 0524  BP: (!) 116/53 (!) 112/58  Pulse: 77 94  Resp: 16   Temp: 100 F (37.8 C) 99.6 F (37.6 C)  SpO2: 93% 96%    Neurovascular intact Incision: dressing C/D/I  LABS Recent Labs    03/02/19 1703 03/03/19 0247 03/04/19 0325  HGB 14.6 13.9 12.5  HCT 44.3 41.6 36.5  WBC 11.6* 6.9 6.9  PLT 245 243 165    Recent Labs    03/02/19 1703 03/03/19 0247 03/04/19 0325  NA 139 139 135  K 3.4* 3.9 3.5  BUN 15 14 11   CREATININE 0.84 0.62 0.57  GLUCOSE 113* 104* 136*    Recent Labs    03/02/19 1703 03/03/19 0247  INR 1.0 1.0     Assessment/Plan: 1 Day Post-Op Procedure(s) (LRB): TOTAL HIP ARTHROPLASTY ANTERIOR APPROACH (Right)   Advance diet Up with therapy   PT - WBAT RLE Plan for home discharge Thursday or Friday depending on mobility with PT RTC in 2 weeks Chewable 81mg  ASA BID for 4 weeks for DVT prophlylaxis Pain control

## 2019-03-04 NOTE — Progress Notes (Signed)
Patient Demographics:    Rebecca Sims, is a 63 y.o. female, DOB - 08-06-1956, ZOX:[id number]  Admit date - 03/02/2019   Admitting Physician Briscoe Deutscher, MD  Outpatient Primary MD for the patient is Patient, No Pcp Per  LOS - 2   Chief Complaint  Patient presents with   Fall        Subjective:    Rebecca Sims today has no fevers, no emesis,  No chest pain, c/o Rt Hip pain with ROM....  Assessment  & Plan :    Principal Problem:   Closed fracture of right hip (HCC) Active Problems:   Hypokalemia   LBBB (left bundle branch block)   Brief Summary Patient is a 63 year old female with history of seasonal allergies, hypothyroidism admitted 03/02/19 with right hip pain after mechanical fall (her Dog knocked her to the ground).   Imaging showed minimally displaced subcapsular right femoral neck fracture, s/p Right total hip replacement through an anterior approach on 03/03/19   Plan:- 1)Rt Hip Fx-- s/p Right total hip replacement through an anterior approach on 03/03/19 by Dr Charlann Boxer  2)Hypothyroidism--- TSH 1.5, c/n Levothyroxine 88 mcg....  3) LBBB (left bundle branch block) -Incidentally noted on admission EKG, no prior history of heart disease, no cardiac symptoms. -No further testing needed  Disposition/Need for in-Hospital Stay- patient unable to be discharged at this time due to per orthopedic surgery possible discharge home with home health on 03/05/2019.... Phy Therapy states patient too weak to go home at this time  Code Status : Full   Family Communication:   na   Disposition Plan  : Home with HH   Consults  :  ortho  DVT Prophylaxis  :  SCDs/ASA 325 mg   Lab Results  Component Value Date   PLT 165 03/04/2019    Inpatient Medications  Scheduled Meds:  aspirin EC  325 mg Oral BID   cholecalciferol  2,000 Units Oral BID   docusate sodium  100 mg Oral BID   ferrous  sulfate  325 mg Oral TID PC   levothyroxine  88 mcg Oral Q0600   loratadine  10 mg Oral QHS   multivitamin with minerals  1 tablet Oral Daily   Ensure Max Protein  11 oz Oral Daily   vitamin C  500 mg Oral Daily   Continuous Infusions:  methocarbamol (ROBAXIN) IV     PRN Meds:.HYDROcodone-acetaminophen, HYDROmorphone (DILAUDID) injection, menthol-cetylpyridinium **OR** phenol, methocarbamol **OR** methocarbamol (ROBAXIN) IV, metoCLOPramide **OR** metoCLOPramide (REGLAN) injection, morphine injection, ondansetron **OR** ondansetron (ZOFRAN) IV, polyethylene glycol    Anti-infectives (From admission, onward)   Start     Dose/Rate Route Frequency Ordered Stop   03/03/19 1330  ceFAZolin (ANCEF) IVPB 2g/100 mL premix     2 g 200 mL/hr over 30 Minutes Intravenous Every 6 hours 03/03/19 1328 03/03/19 2124   03/03/19 1000  ceFAZolin (ANCEF) IVPB 2g/100 mL premix     2 g 200 mL/hr over 30 Minutes Intravenous  Once 03/03/19 0951 03/03/19 1020        Objective:   Vitals:   03/03/19 2200 03/03/19 2327 03/04/19 0524 03/04/19 1447  BP: (!) 119/48 (!) 116/53 (!) 112/58 (!) 106/57  Pulse: 87 77 94 81  Resp: 16 16  16  Temp: 98.9 F (37.2 C) 100 F (37.8 C) 99.6 F (37.6 C) 99.3 F (37.4 C)  TempSrc:  Oral Oral Oral  SpO2: 96% 93% 96% 95%  Weight:      Height:        Wt Readings from Last 3 Encounters:  03/02/19 72.6 kg     Intake/Output Summary (Last 24 hours) at 03/04/2019 1553 Last data filed at 03/04/2019 1500 Gross per 24 hour  Intake 220.4 ml  Output --  Net 220.4 ml     Physical Exam Patient is examined daily including today on 03/04/19 , exams remain the same as of yesterday except that has changed   Gen:- Awake Alert,  In no apparent distress  HEENT:- [place].AT, No sclera icterus Neck-Supple Neck,No JVD,.  Lungs-  CTAB , fair symmetrical air movement CV- S1, S2 normal, regular  Abd-  +ve B.Sounds, Abd Soft, No tenderness,    Extremity/Skin:-Right hip postop wound  clean dry and intact, pedal pulses present  Psych-affect is appropriate, oriented x3 Neuro-no new focal deficits, no tremors   Data Review:   Micro Results Recent Results (from the past 240 hour(s))  MRSA PCR Screening     Status: None   Collection Time: 03/02/19 10:54 PM  Result Value Ref Range Status   MRSA by PCR NEGATIVE NEGATIVE Final    Comment:        The GeneXpert MRSA Assay (FDA approved for NASAL specimens only), is one component of a comprehensive MRSA colonization surveillance program. It is not intended to diagnose MRSA infection nor to guide or monitor treatment for MRSA infections. Performed at [facility], [address], [place], [place] [address]     Radiology Reports Pelvis Portable  Result Date: 03/03/2019 CLINICAL DATA:  63 year old female undergoing right hip replacement EXAM: PORTABLE PELVIS 1-2 VIEWS COMPARISON:  Preoperative radiographs 03/02/2019 FINDINGS: Interval surgical changes of right total hip arthroplasty. No evidence of immediate hardware complication. Subcutaneous emphysema present lateral to the greater trochanter and proximal diaphysis. The left hip and visualized pelvis are unremarkable. IMPRESSION: Interval right total hip arthroplasty without evidence of immediate hardware complication. Electronically Signed   By: Malachy Moan M.D.   On: 03/03/2019 12:57   Chest Portable 1 View  Result Date: 03/02/2019 CLINICAL DATA:  Preop chest x-ray for hip surgery. EXAM: PORTABLE CHEST 1 VIEW COMPARISON:  09/11/2005 FINDINGS: Lungs are clear. Cardiomediastinal silhouette and remainder of the exam is unchanged. IMPRESSION: No active disease. Electronically Signed   By: Elberta Fortis M.D.   On: 03/02/2019 19:37   Dg Hand Complete Left  Result Date: 03/02/2019 CLINICAL DATA:  Left hand pain after fall today. EXAM: LEFT HAND - COMPLETE 3+ VIEW COMPARISON:  None. FINDINGS: Mild degenerative changes over the radiocarpal joint. No acute fracture  or dislocation. IMPRESSION: No acute findings. Electronically Signed   By: Elberta Fortis M.D.   On: 03/02/2019 16:56   Dg C-arm 1-60 Min  Result Date: 03/03/2019 CLINICAL DATA:  Right femoral fracture EXAM: DG C-ARM 61-120 MIN; OPERATIVE RIGHT HIP WITH PELVIS COMPARISON:  03/02/2019 FLUOROSCOPY TIME:  Radiation Exposure Index (as provided by the fluoroscopic device): Not available If the device does not provide the exposure index: Fluoroscopy Time:  16 seconds Number of Acquired Images:  2 FINDINGS: Changes consistent with the right hip replacement are noted. A completion film was not obtained. IMPRESSION: Right hip replacement Electronically Signed   By: Alcide Clever M.D.   On: 03/03/2019 12:21  Dg Hip Operative Unilat W Or W/o Pelvis Right  Result Date: 03/03/2019 CLINICAL DATA:  Right femoral fracture EXAM: DG C-ARM 61-120 MIN; OPERATIVE RIGHT HIP WITH PELVIS COMPARISON:  03/02/2019 FLUOROSCOPY TIME:  Radiation Exposure Index (as provided by the fluoroscopic device): Not available If the device does not provide the exposure index: Fluoroscopy Time:  16 seconds Number of Acquired Images:  2 FINDINGS: Changes consistent with the right hip replacement are noted. A completion film was not obtained. IMPRESSION: Right hip replacement Electronically Signed   By: Alcide Clever M.D.   On: 03/03/2019 12:21   Dg Hip Unilat With Pelvis 2-3 Views Right  Result Date: 03/02/2019 CLINICAL DATA:  Right hip pain after fall today. EXAM: DG HIP (WITH OR WITHOUT PELVIS) 2-3V RIGHT COMPARISON:  None. FINDINGS: Examination demonstrates a minimally displaced subcapital fracture of the right femoral neck. Mild fecal retention over the rectum. Remaining bony and soft tissue structures are unremarkable. IMPRESSION: Minimally displaced subcapital right femoral neck fracture. Electronically Signed   By: Elberta Fortis M.D.   On: 03/02/2019 16:55     CBC Recent Labs  Lab 03/02/19 1703 03/03/19 0247 03/04/19 0325  WBC 11.6*  6.9 6.9  HGB 14.6 13.9 12.5  HCT 44.3 41.6 36.5  PLT 245 243 165  MCV 100.5* 99.3 99.5  MCH 33.1 33.2 34.1*  MCHC 33.0 33.4 34.2  RDW 12.2 12.3 12.1  LYMPHSABS 1.0  --   --   MONOABS 0.5  --   --   EOSABS 0.0  --   --   BASOSABS 0.0  --   --     Chemistries  Recent Labs  Lab 03/02/19 1703 03/03/19 0247 03/04/19 0325  NA 139 139 135  K 3.4* 3.9 3.5  CL 105 105 104  CO2 26 25 23   GLUCOSE 113* 104* 136*  BUN 15 14 11   CREATININE 0.84 0.62 0.57  CALCIUM 9.3 8.9   9.0 8.3*  MG  --  2.2  --   AST  --  27  --   ALT  --  21  --   ALKPHOS  --  56  --   BILITOT  --  1.2  --    ------------------------------------------------------------------------------------------------------------------ No results for input(s): CHOL, HDL, LDLCALC, TRIG, CHOLHDL, LDLDIRECT in the last 72 hours.  No results found for: HGBA1C ------------------------------------------------------------------------------------------------------------------ Recent Labs    03/03/19 0247  TSH 1.151   ------------------------------------------------------------------------------------------------------------------ No results for input(s): VITAMINB12, FOLATE, FERRITIN, TIBC, IRON, RETICCTPCT in the last 72 hours.  Coagulation profile Recent Labs  Lab 03/02/19 1703 03/03/19 0247  INR 1.0 1.0    No results for input(s): DDIMER in the last 72 hours.  Cardiac Enzymes No results for input(s): CKMB, TROPONINI, MYOGLOBIN in the last 168 hours.  Invalid input(s): CK ------------------------------------------------------------------------------------------------------------------ No results found for: BNP   Shon Hale M.D on 03/04/2019 at 3:53 PM  Go to [email or web address] - for contact info  [facility] - Office  [phone]

## 2019-03-05 LAB — BASIC METABOLIC PANEL
Anion gap: 6 (ref 5–15)
BUN: 15 mg/dL (ref 8–23)
CO2: 27 mmol/L (ref 22–32)
Calcium: 8.1 mg/dL — ABNORMAL LOW (ref 8.9–10.3)
Chloride: 102 mmol/L (ref 98–111)
Creatinine, Ser: 0.52 mg/dL (ref 0.44–1.00)
GFR calc Af Amer: >60 mL/min (ref >60)
GFR calc non Af Amer: >60 mL/min (ref >60)
Glucose, Bld: 105 mg/dL — ABNORMAL HIGH (ref 70–99)
Potassium: 3.5 mmol/L (ref 3.5–5.1)
Sodium: 135 mmol/L (ref 135–145)

## 2019-03-05 LAB — CBC
HCT: 35.7 % — ABNORMAL LOW (ref 36.0–46.0)
Hemoglobin: 11.8 g/dL — ABNORMAL LOW (ref 12.0–15.0)
MCH: 32.7 pg (ref 26.0–34.0)
MCHC: 33.1 g/dL (ref 30.0–36.0)
MCV: 98.9 fL (ref 80.0–100.0)
Platelets: 153 10*3/uL (ref 150–400)
RBC: 3.61 MIL/uL — ABNORMAL LOW (ref 3.87–5.11)
RDW: 12.1 % (ref 11.5–15.5)
WBC: 7.1 10*3/uL (ref 4.0–10.5)
nRBC: 0 % (ref 0.0–0.2)

## 2019-03-05 MED ORDER — BISACODYL 10 MG RE SUPP
10.0000 mg | Freq: Once | RECTAL | Status: AC
  Administered 2019-03-05: 10 mg via RECTAL
  Filled 2019-03-05: qty 1

## 2019-03-05 MED ORDER — POLYETHYLENE GLYCOL 3350 17 G PO PACK: 17.0000 g | PACK | Freq: Two times a day (BID) | ORAL | 0 refills | Status: DC

## 2019-03-05 MED ORDER — SENNOSIDES-DOCUSATE SODIUM 8.6-50 MG PO TABS: 2.0000 | ORAL_TABLET | Freq: Every day | ORAL | 3 refills | Status: DC

## 2019-03-05 MED ORDER — METHOCARBAMOL 500 MG PO TABS: 500.0000 mg | ORAL_TABLET | Freq: Three times a day (TID) | ORAL | 0 refills | Status: DC

## 2019-03-05 MED ORDER — FERROUS SULFATE 325 (65 FE) MG PO TABS: 325.0000 mg | ORAL_TABLET | Freq: Three times a day (TID) | ORAL | 0 refills | Status: DC

## 2019-03-05 MED ORDER — ONDANSETRON HCL 4 MG PO TABS: 4.0000 mg | ORAL_TABLET | Freq: Four times a day (QID) | ORAL | 0 refills | Status: DC | PRN

## 2019-03-05 NOTE — Progress Notes (Signed)
Discharge Home. Home discharge instruction given, no question verbalized. HHN addressed by the case manager. No further question verbalized.

## 2019-03-05 NOTE — Progress Notes (Signed)
Physical Therapy Treatment Patient Details Name: Rebecca Sims MRN: [id number] DOB: Aug 23, 1956 Today's Date: 03/05/2019    History of Present Illness R THA direct anterior approach, WBAT after a fall. (3/31)    PT Comments    Patient seen for mobility progression. Pt requires min guard for functional transfers and gait training this session. Gait distance limited by nausea. Pt will need to practice stair prior to d/c as she will going to daughter's home. Continue to progress as tolerated.     Follow Up Recommendations  Home health PT     Equipment Recommendations  Rolling walker with 5" wheels    Recommendations for Other Services       Precautions / Restrictions Restrictions Weight Bearing Restrictions: Yes RLE Weight Bearing: Weight bearing as tolerated    Mobility  Bed Mobility Overal bed mobility: Needs Assistance Bed Mobility: Supine to Sit     Supine to sit: Min assist     General bed mobility comments: assist to bring R LE to EOB  Transfers Overall transfer level: Needs assistance Equipment used: Rolling walker (2 wheeled) Transfers: Sit to/from Stand Sit to Stand: Min guard         General transfer comment: min guard for safety from EOB, chair, and BSC; cues for safe hand placement  Ambulation/Gait Ambulation/Gait assistance: Min guard Gait Distance (Feet): (35 ft X 2 trials with seated rest break) Assistive device: Rolling walker (2 wheeled) Gait Pattern/deviations: Step-to pattern;Decreased stance time - right;Decreased step length - left;Decreased weight shift to right Gait velocity: decreased   General Gait Details: cues for sequencing, step length symmetry, and safe proximity to RW; distance limited by nausea   Stairs             Wheelchair Mobility    Modified Rankin (Stroke Patients Only)       Balance Overall balance assessment: Needs assistance   Sitting balance-Leahy Scale: Good       Standing balance-Leahy Scale:  Poor                              Cognition Arousal/Alertness: Awake/alert Behavior During Therapy: WFL for tasks assessed/performed Overall Cognitive Status: Within Functional Limits for tasks assessed                                        Exercises      General Comments        Pertinent Vitals/Pain Pain Assessment: Faces Faces Pain Scale: Hurts little more Pain Location: R hip Pain Descriptors / Indicators: Sore Pain Intervention(s): Limited activity within patient's tolerance;Monitored during session;Repositioned    Home Living                      Prior Function            PT Goals (current goals can now be found in the care plan section) Acute Rehab PT Goals Patient Stated Goal: go home Progress towards PT goals: Progressing toward goals    Frequency    Min 3X/week      PT Plan Current plan remains appropriate    Co-evaluation              AM-PAC PT "6 Clicks" Mobility   Outcome Measure  Help needed turning from your back to your side while in a flat bed  without using bedrails?: None Help needed moving from lying on your back to sitting on the side of a flat bed without using bedrails?: A Little Help needed moving to and from a bed to a chair (including a wheelchair)?: A Little Help needed standing up from a chair using your arms (e.g., wheelchair or bedside chair)?: A Little Help needed to walk in hospital room?: A Little Help needed climbing 3-5 steps with a railing? : A Little 6 Click Score: 19    End of Session Equipment Utilized During Treatment: Gait belt Activity Tolerance: Other (comment)(limited by nausea) Patient left: with call bell/phone within reach;in chair Nurse Communication: Mobility status PT Visit Diagnosis: Unsteadiness on feet (R26.81)     Time: 8466-5993 PT Time Calculation (min) (ACUTE ONLY): 31 min  Charges:  $Gait Training: 23-37 mins                     Erline Levine,  PTA Acute Rehabilitation Services Pager: [phone] Office: [phone]     Carolynne Edouard 03/05/2019, 11:37 AM

## 2019-03-05 NOTE — Progress Notes (Signed)
Patient ID: Rebecca Sims, female   DOB: Jan 01, 1956, 63 y.o.   MRN: [id number] Subjective: 2 Days Post-Op Procedure(s) (LRB): TOTAL HIP ARTHROPLASTY ANTERIOR APPROACH (Right)    Patient reports pain as mild to moderate.  Was up this am with PT walking when she began to feel nauseous.  Was hoping to be able to go home soon  Objective:   VITALS:   Vitals:   03/04/19 2009 03/05/19 0427  BP: (!) 103/55 (!) 105/56  Pulse: 87 76  Resp: 16 16  Temp: 99.9 F (37.7 C) 98.4 F (36.9 C)  SpO2: 97% 97%    Neurovascular intact Incision: dressing C/D/I  LABS Recent Labs    03/03/19 0247 03/04/19 0325 03/05/19 0207  HGB 13.9 12.5 11.8*  HCT 41.6 36.5 35.7*  WBC 6.9 6.9 7.1  PLT 243 165 153    Recent Labs    03/03/19 0247 03/04/19 0325 03/05/19 0207  NA 139 135 135  K 3.9 3.5 3.5  BUN 14 11 15   CREATININE 0.62 0.57 0.52  GLUCOSE 104* 136* 105*    Recent Labs    03/02/19 1703 03/03/19 0247  INR 1.0 1.0     Assessment/Plan: 2 Days Post-Op Procedure(s) (LRB): TOTAL HIP ARTHROPLASTY ANTERIOR APPROACH (Right)   Up with therapy  Needs to work on steps prior to discharge RTC in 2 weeks Discharge today if improved nausea and mobility training

## 2019-03-05 NOTE — Discharge Summary (Signed)
Rebecca Sims, is a 63 y.o. female  DOB [birth year]  MRN [id number].  Admission date:  03/02/2019  Admitting Physician  Briscoe Deutscher, MD  Discharge Date:  03/05/2019   Primary MD  Patient, No Pcp Per  Recommendations for primary care physician for things to follow:   1) you are taking aspirin for blood thinner so Avoid ibuprofen/Advil/Aleve/Motrin/Goody Powders/Naproxen/BC powders/Meloxicam/Diclofenac/Indomethacin and other Nonsteroidal anti-inflammatory medications as these will make you more likely to bleed and can cause stomach ulcers, can also cause Kidney problems.   2) activity limitations/restrictions as advised by orthopedic surgeon and physical therapist  3) outpatient follow-up with orthopedic surgeon as advised   Admission Diagnosis  Preoperative testing [Z01.818] Closed fracture of right hip, initial encounter (HCC) [S72.001A] Closed right hip fracture, initial encounter [facility]) [S72.001A]   Discharge Diagnosis  Preoperative testing [Z01.818] Closed fracture of right hip, initial encounter (HCC) [S72.001A] Closed right hip fracture, initial encounter (HCC) [S72.001A]    Principal Problem:   Closed fracture of right hip (HCC) Active Problems:   Hypokalemia   LBBB (left bundle branch block)      Past Medical History:  Diagnosis Date  . Hypothyroidism     Past Surgical History:  Procedure Laterality Date  . TOTAL HIP ARTHROPLASTY Right 03/03/2019   Procedure: TOTAL HIP ARTHROPLASTY ANTERIOR APPROACH;  Surgeon: Durene Romans, MD;  Location: [facility] OR;  Service: Orthopedics;  Laterality: Right;       HPI  from the history and physical done on the day of admission:    Patient coming from: home   Chief Complaint: Fall with right hip pain  HPI: Rebecca Sims is a 63 y.o. female with medical history significant for seasonal allergy and hypothyroidism, now presenting to the  emergency department for evaluation of right hip pain after mechanical fall.  The patient was in her usual state of good health, having an uneventful day, and was walking her dog when the dog lunged and knocked her to the ground.  She fell onto her right side without hitting her head or losing consciousness, experienced immediate and severe pain at the right hip, and now presents for evaluation.  She also had some pain at the left elbow and right hand with abrasions.  Patient reports that she is physically active, walking her dog daily, ascending stairs without any dyspnea, and never experiences any chest pain or chest discomfort with activity.  She denies any history of heart or lung disease.  She denies any recent fevers, chills, cough, shortness of breath, dysuria, abdominal pain, or diarrhea.  ED Course: Upon arrival to the ED, patient is found to be saturating well on room air, and with remaining vitals also normal.  EKG features a sinus rhythm with left bundle branch block.  Radiographs of the left hand are negative and hip films demonstrate minimally displaced subcapsular right femoral neck fracture.  Chemistry panel is notable for slight hypokalemia and CBC features a mild leukocytosis.  Tdap was updated, morphine was given for pain control, type  and screen was performed, and orthopedic surgery was consulted by the ED physician, recommending a medical admission with tentative plans for surgery tomorrow.   Hospital Course:   Principal Problem:   Closed fracture of right hip (HCC) Active Problems:   Hypokalemia   LBBB (left bundle branch block)   Brief Summary Patient is a 63 year old female with history of seasonal allergies, hypothyroidism admitted 03/02/19 with right hip pain after mechanical fall (her Dog knocked her to the ground).  Imaging showed minimally displaced subcapsular right femoral neck fracture, s/p Righttotal hip replacement through an anterior approach on 03/03/19    Plan:- 1)Rt Hip Fx-- s/p Righttotal hip replacement through an anterior approach on 03/03/19 by Dr Charlann Boxer, orthopedic follow-up as advised , activity and weightbearing limitations as advised by Ortho  2)Hypothyroidism--- TSH 1.5, c/n Levothyroxine 88 mcg....  3)LBBB (left bundle branch block) -Incidentally noted on admission EKG, no prior history of heart disease, no cardiac symptoms. -No further testing needed  Disposition/Need for in-Hospital Stay- patient unable to be discharged at this time due to per orthopedic surgery possible discharge home with home health on 03/05/2019.... Phy Therapy states patient too weak to go home at this time  Code Status : Full   Family Communication:   na   Disposition Plan  : Home with HH   Consults  :  ortho  DVT Prophylaxis  :  SCDs/ASA 325 mg   Discharge Condition: stable  Follow UP  Follow-up Information    Durene Romans, MD. Schedule an appointment as soon as possible for a visit in 2 weeks.   Specialty:  Orthopedic Surgery Contact information: [address] [place] [place] [address] [phone]        [facility]-Home Follow up.   Specialty:  Home Health Services Why:  A representative from [facility] will contact you to arrange start date and time for your therapy.          Diet and Activity recommendation:  As advised  Discharge Instructions    Discharge Instructions    Call MD for:  difficulty breathing, headache or visual disturbances   Complete by:  As directed    Call MD for:  persistant dizziness or light-headedness   Complete by:  As directed    Call MD for:  persistant nausea and vomiting   Complete by:  As directed    Call MD for:  severe uncontrolled pain   Complete by:  As directed    Call MD for:  temperature >100.4   Complete by:  As directed    Diet - low sodium heart healthy   Complete by:  As directed    Discharge instructions   Complete by:  As directed     1) you are taking aspirin for blood thinner so Avoid ibuprofen/Advil/Aleve/Motrin/Goody Powders/Naproxen/BC powders/Meloxicam/Diclofenac/Indomethacin and other Nonsteroidal anti-inflammatory medications as these will make you more likely to bleed and can cause stomach ulcers, can also cause Kidney problems.   2) activity limitations/restrictions as advised by orthopedic surgeon and physical therapist  3) outpatient follow-up with orthopedic surgeon as advised   Increase activity slowly   Complete by:  As directed         Discharge Medications     Allergies as of 03/05/2019   No Known Allergies     Medication List    TAKE these medications   acetaminophen 500 MG tablet Commonly known as:  TYLENOL Take 500-1,000 mg by mouth every 8 (eight) hours  as needed (for pain or headaches).   Alive Once Daily Womens 50+ Tabs Take 1 tablet by mouth daily with breakfast.   aspirin 81 MG chewable tablet Commonly known as:  Aspirin Childrens Chew 1 tablet (81 mg total) by mouth 2 (two) times daily for 30 days. Take for 4 weeks, then resume regular dose.   ferrous sulfate 325 (65 FE) MG tablet Commonly known as:  FerrouSul Take 1 tablet (325 mg total) by mouth 3 (three) times daily with meals.   fexofenadine 180 MG tablet Commonly known as:  ALLEGRA Take 180 mg by mouth at bedtime.   HYDROcodone-acetaminophen 7.5-325 MG tablet Commonly known as:  Norco Take 1-2 tablets by mouth every 4 (four) hours as needed for moderate pain.   methocarbamol 500 MG tablet Commonly known as:  Robaxin Take 1 tablet (500 mg total) by mouth 3 (three) times daily.   ondansetron 4 MG tablet Commonly known as:  ZOFRAN Take 1 tablet (4 mg total) by mouth every 6 (six) hours as needed for nausea.   polyethylene glycol packet Commonly known as:  MIRALAX / GLYCOLAX Take 17 g by mouth 2 (two) times daily.   senna-docusate 8.6-50 MG tablet Commonly known as:  Senokot-S Take 2 tablets by mouth at bedtime.    SIMPLY SALINE NA Place 1-2 sprays into both nostrils at bedtime.   Synthroid 88 MCG tablet Generic drug:  levothyroxine Take 88 mcg by mouth daily before breakfast.   Tylenol Sinus Severe 5-325-200 MG Tabs Generic drug:  Phenylephrine-APAP-guaiFENesin Take 1 tablet by mouth every 6 (six) hours as needed (for sinus-related symptoms).   vitamin C 500 MG tablet Commonly known as:  ASCORBIC ACID Take 500 mg by mouth daily.            Durable Medical Equipment  (From admission, onward)         Start     Ordered   03/04/19 1540  For home use only DME Walker rolling  Once    Question:  Patient needs a walker to treat with the following condition  Answer:  S/P total hip arthroplasty   03/04/19 1540          Major procedures and Radiology Reports - PLEASE review detailed and final reports for all details, in brief -   Pelvis Portable  Result Date: 03/03/2019 CLINICAL DATA:  63 year old female undergoing right hip replacement EXAM: PORTABLE PELVIS 1-2 VIEWS COMPARISON:  Preoperative radiographs 03/02/2019 FINDINGS: Interval surgical changes of right total hip arthroplasty. No evidence of immediate hardware complication. Subcutaneous emphysema present lateral to the greater trochanter and proximal diaphysis. The left hip and visualized pelvis are unremarkable. IMPRESSION: Interval right total hip arthroplasty without evidence of immediate hardware complication. Electronically Signed   By: Malachy Moan M.D.   On: 03/03/2019 12:57   Chest Portable 1 View  Result Date: 03/02/2019 CLINICAL DATA:  Preop chest x-ray for hip surgery. EXAM: PORTABLE CHEST 1 VIEW COMPARISON:  09/11/2005 FINDINGS: Lungs are clear. Cardiomediastinal silhouette and remainder of the exam is unchanged. IMPRESSION: No active disease. Electronically Signed   By: Elberta Fortis M.D.   On: 03/02/2019 19:37   Dg Hand Complete Left  Result Date: 03/02/2019 CLINICAL DATA:  Left hand pain after fall today. EXAM:  LEFT HAND - COMPLETE 3+ VIEW COMPARISON:  None. FINDINGS: Mild degenerative changes over the radiocarpal joint. No acute fracture or dislocation. IMPRESSION: No acute findings. Electronically Signed   By: Elberta Fortis M.D.   On: 03/02/2019 16:56  Dg C-arm 1-60 Min  Result Date: 03/03/2019 CLINICAL DATA:  Right femoral fracture EXAM: DG C-ARM 61-120 MIN; OPERATIVE RIGHT HIP WITH PELVIS COMPARISON:  03/02/2019 FLUOROSCOPY TIME:  Radiation Exposure Index (as provided by the fluoroscopic device): Not available If the device does not provide the exposure index: Fluoroscopy Time:  16 seconds Number of Acquired Images:  2 FINDINGS: Changes consistent with the right hip replacement are noted. A completion film was not obtained. IMPRESSION: Right hip replacement Electronically Signed   By: Alcide Clever M.D.   On: 03/03/2019 12:21   Dg Hip Operative Unilat W Or W/o Pelvis Right  Result Date: 03/03/2019 CLINICAL DATA:  Right femoral fracture EXAM: DG C-ARM 61-120 MIN; OPERATIVE RIGHT HIP WITH PELVIS COMPARISON:  03/02/2019 FLUOROSCOPY TIME:  Radiation Exposure Index (as provided by the fluoroscopic device): Not available If the device does not provide the exposure index: Fluoroscopy Time:  16 seconds Number of Acquired Images:  2 FINDINGS: Changes consistent with the right hip replacement are noted. A completion film was not obtained. IMPRESSION: Right hip replacement Electronically Signed   By: Alcide Clever M.D.   On: 03/03/2019 12:21   Dg Hip Unilat With Pelvis 2-3 Views Right  Result Date: 03/02/2019 CLINICAL DATA:  Right hip pain after fall today. EXAM: DG HIP (WITH OR WITHOUT PELVIS) 2-3V RIGHT COMPARISON:  None. FINDINGS: Examination demonstrates a minimally displaced subcapital fracture of the right femoral neck. Mild fecal retention over the rectum. Remaining bony and soft tissue structures are unremarkable. IMPRESSION: Minimally displaced subcapital right femoral neck fracture. Electronically Signed    By: Elberta Fortis M.D.   On: 03/02/2019 16:55    Micro Results   Recent Results (from the past 240 hour(s))  MRSA PCR Screening     Status: None   Collection Time: 03/02/19 10:54 PM  Result Value Ref Range Status   MRSA by PCR NEGATIVE NEGATIVE Final    Comment:        The GeneXpert MRSA Assay (FDA approved for NASAL specimens only), is one component of a comprehensive MRSA colonization surveillance program. It is not intended to diagnose MRSA infection nor to guide or monitor treatment for MRSA infections. Performed at [facility], [address] [place], [place] [address]    Today   Subjective    Rebecca Sims today has no new complaints, eager to go home          Patient has been seen and examined prior to discharge   Objective   Blood pressure (!) 105/56, pulse 76, temperature 98.4 F (36.9 C), temperature source Oral, resp. rate 16, height  (1.626 m), weight 72.6 kg, SpO2 97 %.   Intake/Output Summary (Last 24 hours) at 03/05/2019 0958 Last data filed at 03/04/2019 2300 Gross per 24 hour  Intake 360 ml  Output -  Net 360 ml    Exam Gen:- Awake Alert,  In no apparent distress  HEENT:- [place].AT, No sclera icterus Neck-Supple Neck,No JVD,.  Lungs-  CTAB , fair symmetrical air movement CV- S1, S2 normal, regular  Abd-  +ve B.Sounds, Abd Soft, No tenderness,    Extremity/Skin:-Right hip postop wound clean dry and intact, pedal pulses present  Psych-affect is appropriate, oriented x3 Neuro-no new focal deficits, no tremors   Data Review   CBC w Diff:  Lab Results  Component Value Date   WBC 7.1 03/05/2019   HGB 11.8 (L) 03/05/2019   HCT 35.7 (L) 03/05/2019   PLT 153 03/05/2019  LYMPHOPCT 9 03/02/2019   MONOPCT 4 03/02/2019   EOSPCT 0 03/02/2019   BASOPCT 0 03/02/2019    CMP:  Lab Results  Component Value Date   NA 135 03/05/2019   K 3.5 03/05/2019   CL 102 03/05/2019   CO2 27 03/05/2019   BUN 15 03/05/2019   CREATININE 0.52  03/05/2019   PROT 6.0 (L) 03/03/2019   ALBUMIN 3.8 03/03/2019   BILITOT 1.2 03/03/2019   ALKPHOS 56 03/03/2019   AST 27 03/03/2019   ALT 21 03/03/2019  .   Total Discharge time is about 33 minutes  Shon Haleourage Deedra Pro M.D on 03/05/2019 at 9:58 AM  Go to [email or web address] -  for contact info  [facility] - Office  [phone]

## 2019-03-05 NOTE — Evaluation (Signed)
Occupational Therapy Evaluation and Discharge Patient Details Name: Rebecca Sims MRN: [id number] DOB: 1956-11-01 Today's Date: 03/05/2019    History of Present Illness R THA direct anterior approach, WBAT after a fall. (3/31)   Clinical Impression   Pt educated in LB bathing and dressing with AE, uses of 3 in 1, tub transfers, transporting items with RW and safe footwear. Verbalizing and/or demonstrated understanding. No further OT needs.    Follow Up Recommendations  No OT follow up    Equipment Recommendations  3 in 1 bedside commode    Recommendations for Other Services       Precautions / Restrictions Precautions Precautions: Fall Restrictions Weight Bearing Restrictions: Yes RLE Weight Bearing: Weight bearing as tolerated      Mobility Bed Mobility Overal bed mobility: Needs Assistance Bed Mobility: Supine to Sit     Supine to sit: Min assist     General bed mobility comments: pt in chair  Transfers Overall transfer level: Needs assistance Equipment used: Rolling walker (2 wheeled) Transfers: Sit to/from Stand Sit to Stand: Min guard         General transfer comment: for safety    Balance Overall balance assessment: Needs assistance   Sitting balance-Leahy Scale: Good       Standing balance-Leahy Scale: Poor                             ADL either performed or assessed with clinical judgement   ADL Overall ADL's : Needs assistance/impaired Eating/Feeding: Independent;Sitting   Grooming: Wash/dry hands;Standing;Min guard   Upper Body Bathing: Set up;Sitting   Lower Body Bathing: Minimal assistance;Sit to/from stand Lower Body Bathing Details (indicate cue type and reason): recommended long handled bath sponge Upper Body Dressing : Set up;Sitting   Lower Body Dressing: Minimal assistance;Sit to/from stand Lower Body Dressing Details (indicate cue type and reason): educated in use of reacher and sock aide Toilet Transfer: Min  guard;Ambulation;RW;BSC Toilet Transfer Details (indicate cue type and reason): educated in multiple uses of 3 in 1 Toileting- Architect and Hygiene: Min guard;Sit to/from Nurse, children's Details (indicate cue type and reason): educated in tub transfer with 3 in 1 and gave handout to reinforce Functional mobility during ADLs: Min guard;Rolling walker General ADL Comments: instructed in safe footwear and how to transport items safely with RW     Vision Patient Visual Report: No change from baseline       Perception     Praxis      Pertinent Vitals/Pain Pain Assessment: Faces Faces Pain Scale: Hurts little more Pain Location: R hip Pain Descriptors / Indicators: Sore Pain Intervention(s): Monitored during session;Premedicated before session;Repositioned     Hand Dominance Right   Extremity/Trunk Assessment Upper Extremity Assessment Upper Extremity Assessment: Overall WFL for tasks assessed   Lower Extremity Assessment Lower Extremity Assessment: Defer to PT evaluation   Cervical / Trunk Assessment Cervical / Trunk Assessment: Normal   Communication Communication Communication: No difficulties   Cognition Arousal/Alertness: Awake/alert Behavior During Therapy: WFL for tasks assessed/performed Overall Cognitive Status: Within Functional Limits for tasks assessed                                     General Comments       Exercises     Shoulder Instructions      Home  Living Family/patient expects to be discharged to:: Private residence Living Arrangements: Children(will stay with her daughter) Available Help at Discharge: Family Type of Home: House Home Access: Level entry     Home Layout: One level     Bathroom Shower/Tub: Chief Strategy Officer: Standard     Home Equipment: None          Prior Functioning/Environment Level of Independence: Independent        Comments: works at [employer] List: Impaired balance (sitting and/or standing)      OT Treatment/Interventions:      OT Goals(Current goals can be found in the care plan section) Acute Rehab OT Goals Patient Stated Goal: go home  OT Frequency:     Barriers to D/C:            Co-evaluation              AM-PAC OT "6 Clicks" Daily Activity     Outcome Measure Help from another person eating meals?: None Help from another person taking care of personal grooming?: A Little Help from another person toileting, which includes using toliet, bedpan, or urinal?: A Little Help from another person bathing (including washing, rinsing, drying)?: A Little Help from another person to put on and taking off regular upper body clothing?: None Help from another person to put on and taking off regular lower body clothing?: A Little 6 Click Score: 20   End of Session Equipment Utilized During Treatment: Gait belt;Rolling walker  Activity Tolerance: Patient tolerated treatment well Patient left: in chair;with call bell/phone within reach  OT Visit Diagnosis: Unsteadiness on feet (R26.81);Other abnormalities of gait and mobility (R26.89);Pain                Time: 9678-9381 OT Time Calculation (min): 39 min Charges:  OT General Charges $OT Visit: 1 Visit OT Evaluation $OT Eval Low Complexity: 1 Low OT Treatments $Self Care/Home Management : 23-37 mins  Martie Round, OTR/L Acute Rehabilitation Services Pager: [phone] Office: [phone]  Rebecca Sims 03/05/2019, 1:48 PM

## 2019-03-05 NOTE — Progress Notes (Signed)
Physical Therapy Treatment Patient Details Name: Rebecca Sims MRN: [id number] DOB: 03-27-1956 Today's Date: 03/05/2019    History of Present Illness R THA direct anterior approach, WBAT after a fall. (3/31)    PT Comments    Patient is making good progress with PT.  From a mobility standpoint anticipate patient will be ready for DC home when medically ready.    Follow Up Recommendations  Home health PT     Equipment Recommendations  Rolling walker with 5" wheels    Recommendations for Other Services       Precautions / Restrictions Precautions Precautions: Fall Restrictions Weight Bearing Restrictions: Yes RLE Weight Bearing: Weight bearing as tolerated    Mobility  Bed Mobility Overal bed mobility: Needs Assistance Bed Mobility: Sit to Supine       Sit to supine: Supervision   General bed mobility comments: supervision for safety  Transfers Overall transfer level: Needs assistance Equipment used: Rolling walker (2 wheeled) Transfers: Sit to/from Stand Sit to Stand: Min guard         General transfer comment: pt demonstrates carry over of safe hand placement   Ambulation/Gait Ambulation/Gait assistance: Min guard Gait Distance (Feet): 150 Feet Assistive device: Rolling walker (2 wheeled) Gait Pattern/deviations: Step-to pattern;Decreased stance time - right;Decreased step length - left;Decreased weight shift to right;Step-through pattern Gait velocity: decreased   General Gait Details: cues for sequencing, step length symmetry, and increased R hip/knee flexion during swing phase   Stairs Stairs: Yes Stairs assistance: Min assist Stair Management: No rails;Step to pattern;Backwards;With walker Number of Stairs: (2 steps X 2 trials) General stair comments: cues for sequencing and technique; assist to stabilize RW    Wheelchair Mobility    Modified Rankin (Stroke Patients Only)       Balance Overall balance assessment: Needs assistance    Sitting balance-Leahy Scale: Good       Standing balance-Leahy Scale: Fair Standing balance comment: pt is able to static stand without UE support                            Cognition Arousal/Alertness: Awake/alert Behavior During Therapy: WFL for tasks assessed/performed Overall Cognitive Status: Within Functional Limits for tasks assessed                                        Exercises      General Comments        Pertinent Vitals/Pain Pain Assessment: 0-10 Pain Score: 3  Faces Pain Scale: Hurts little more Pain Location: R hip Pain Descriptors / Indicators: Sore Pain Intervention(s): Limited activity within patient's tolerance;Monitored during session;Repositioned    Home Living Family/patient expects to be discharged to:: Private residence Living Arrangements: Children(will stay with her daughter) Available Help at Discharge: Family Type of Home: House Home Access: Level entry   Home Layout: One level Home Equipment: None      Prior Function Level of Independence: Independent      Comments: works at [employer] (current goals can now be found in the care plan section) Acute Rehab PT Goals Patient Stated Goal: go home Progress towards PT goals: Progressing toward goals    Frequency    Min 3X/week      PT Plan Current plan remains appropriate    Co-evaluation  AM-PAC PT "6 Clicks" Mobility   Outcome Measure  Help needed turning from your back to your side while in a flat bed without using bedrails?: None Help needed moving from lying on your back to sitting on the side of a flat bed without using bedrails?: A Little Help needed moving to and from a bed to a chair (including a wheelchair)?: A Little Help needed standing up from a chair using your arms (e.g., wheelchair or bedside chair)?: A Little Help needed to walk in hospital room?: A Little Help needed climbing 3-5 steps with a  railing? : A Little 6 Click Score: 19    End of Session Equipment Utilized During Treatment: Gait belt Activity Tolerance: Patient tolerated treatment well Patient left: with call bell/phone within reach;in bed Nurse Communication: Mobility status PT Visit Diagnosis: Unsteadiness on feet (R26.81)     Time: 2633-3545 PT Time Calculation (min) (ACUTE ONLY): 17 min  Charges:  $Gait Training: 8-22 mins                     Erline Levine, PTA Acute Rehabilitation Services Pager: [phone] Office: [phone]     Carolynne Edouard 03/05/2019, 3:41 PM

## 2019-03-06 DIAGNOSIS — I447 Left bundle-branch block, unspecified: Secondary | ICD-10-CM | POA: Diagnosis not present

## 2019-03-06 DIAGNOSIS — Z7982 Long term (current) use of aspirin: Secondary | ICD-10-CM | POA: Diagnosis not present

## 2019-03-06 DIAGNOSIS — R2689 Other abnormalities of gait and mobility: Secondary | ICD-10-CM | POA: Diagnosis not present

## 2019-03-06 DIAGNOSIS — Z79891 Long term (current) use of opiate analgesic: Secondary | ICD-10-CM | POA: Diagnosis not present

## 2019-03-06 DIAGNOSIS — W19XXXD Unspecified fall, subsequent encounter: Secondary | ICD-10-CM | POA: Diagnosis not present

## 2019-03-06 DIAGNOSIS — S72011D Unspecified intracapsular fracture of right femur, subsequent encounter for closed fracture with routine healing: Secondary | ICD-10-CM | POA: Diagnosis not present

## 2019-03-06 DIAGNOSIS — Z9181 History of falling: Secondary | ICD-10-CM | POA: Diagnosis not present

## 2019-03-06 DIAGNOSIS — Z96641 Presence of right artificial hip joint: Secondary | ICD-10-CM | POA: Diagnosis not present

## 2019-03-11 DIAGNOSIS — W19XXXD Unspecified fall, subsequent encounter: Secondary | ICD-10-CM | POA: Diagnosis not present

## 2019-03-11 DIAGNOSIS — Z9181 History of falling: Secondary | ICD-10-CM | POA: Diagnosis not present

## 2019-03-11 DIAGNOSIS — Z79891 Long term (current) use of opiate analgesic: Secondary | ICD-10-CM | POA: Diagnosis not present

## 2019-03-11 DIAGNOSIS — S72011D Unspecified intracapsular fracture of right femur, subsequent encounter for closed fracture with routine healing: Secondary | ICD-10-CM | POA: Diagnosis not present

## 2019-03-11 DIAGNOSIS — Z7982 Long term (current) use of aspirin: Secondary | ICD-10-CM | POA: Diagnosis not present

## 2019-03-11 DIAGNOSIS — I447 Left bundle-branch block, unspecified: Secondary | ICD-10-CM | POA: Diagnosis not present

## 2019-03-11 DIAGNOSIS — Z96641 Presence of right artificial hip joint: Secondary | ICD-10-CM | POA: Diagnosis not present

## 2019-03-12 DIAGNOSIS — Z79891 Long term (current) use of opiate analgesic: Secondary | ICD-10-CM | POA: Diagnosis not present

## 2019-03-12 DIAGNOSIS — Z96641 Presence of right artificial hip joint: Secondary | ICD-10-CM | POA: Diagnosis not present

## 2019-03-12 DIAGNOSIS — W19XXXD Unspecified fall, subsequent encounter: Secondary | ICD-10-CM | POA: Diagnosis not present

## 2019-03-12 DIAGNOSIS — S72011D Unspecified intracapsular fracture of right femur, subsequent encounter for closed fracture with routine healing: Secondary | ICD-10-CM | POA: Diagnosis not present

## 2019-03-12 DIAGNOSIS — Z7982 Long term (current) use of aspirin: Secondary | ICD-10-CM | POA: Diagnosis not present

## 2019-03-12 DIAGNOSIS — I447 Left bundle-branch block, unspecified: Secondary | ICD-10-CM | POA: Diagnosis not present

## 2019-03-12 DIAGNOSIS — Z9181 History of falling: Secondary | ICD-10-CM | POA: Diagnosis not present

## 2019-03-17 DIAGNOSIS — Z7982 Long term (current) use of aspirin: Secondary | ICD-10-CM | POA: Diagnosis not present

## 2019-03-17 DIAGNOSIS — I447 Left bundle-branch block, unspecified: Secondary | ICD-10-CM | POA: Diagnosis not present

## 2019-03-17 DIAGNOSIS — Z96641 Presence of right artificial hip joint: Secondary | ICD-10-CM | POA: Diagnosis not present

## 2019-03-17 DIAGNOSIS — W19XXXD Unspecified fall, subsequent encounter: Secondary | ICD-10-CM | POA: Diagnosis not present

## 2019-03-17 DIAGNOSIS — S72011D Unspecified intracapsular fracture of right femur, subsequent encounter for closed fracture with routine healing: Secondary | ICD-10-CM | POA: Diagnosis not present

## 2019-03-17 DIAGNOSIS — Z79891 Long term (current) use of opiate analgesic: Secondary | ICD-10-CM | POA: Diagnosis not present

## 2019-03-17 DIAGNOSIS — Z9181 History of falling: Secondary | ICD-10-CM | POA: Diagnosis not present

## 2019-03-19 DIAGNOSIS — I447 Left bundle-branch block, unspecified: Secondary | ICD-10-CM | POA: Diagnosis not present

## 2019-03-19 DIAGNOSIS — Z9181 History of falling: Secondary | ICD-10-CM | POA: Diagnosis not present

## 2019-03-19 DIAGNOSIS — Z79891 Long term (current) use of opiate analgesic: Secondary | ICD-10-CM | POA: Diagnosis not present

## 2019-03-19 DIAGNOSIS — W19XXXD Unspecified fall, subsequent encounter: Secondary | ICD-10-CM | POA: Diagnosis not present

## 2019-03-19 DIAGNOSIS — S72011D Unspecified intracapsular fracture of right femur, subsequent encounter for closed fracture with routine healing: Secondary | ICD-10-CM | POA: Diagnosis not present

## 2019-03-19 DIAGNOSIS — Z96641 Presence of right artificial hip joint: Secondary | ICD-10-CM | POA: Diagnosis not present

## 2019-03-19 DIAGNOSIS — Z7982 Long term (current) use of aspirin: Secondary | ICD-10-CM | POA: Diagnosis not present

## 2019-04-15 DIAGNOSIS — Z471 Aftercare following joint replacement surgery: Secondary | ICD-10-CM | POA: Diagnosis not present

## 2019-04-15 DIAGNOSIS — Z96641 Presence of right artificial hip joint: Secondary | ICD-10-CM | POA: Diagnosis not present

## 2019-07-09 DIAGNOSIS — B351 Tinea unguium: Secondary | ICD-10-CM | POA: Diagnosis not present

## 2019-07-09 DIAGNOSIS — S90465A Insect bite (nonvenomous), left lesser toe(s), initial encounter: Secondary | ICD-10-CM | POA: Diagnosis not present

## 2019-07-14 ENCOUNTER — Ambulatory Visit (INDEPENDENT_AMBULATORY_CARE_PROVIDER_SITE_OTHER): Payer: BC Managed Care – PPO

## 2019-07-14 ENCOUNTER — Other Ambulatory Visit: Payer: Self-pay

## 2019-07-14 ENCOUNTER — Telehealth: Payer: Self-pay | Admitting: *Deleted

## 2019-07-14 ENCOUNTER — Encounter: Payer: Self-pay | Admitting: Podiatry

## 2019-07-14 ENCOUNTER — Ambulatory Visit (INDEPENDENT_AMBULATORY_CARE_PROVIDER_SITE_OTHER): Payer: BC Managed Care – PPO | Admitting: Podiatry

## 2019-07-14 VITALS — Temp 98.2°F

## 2019-07-14 DIAGNOSIS — W57XXXA Bitten or stung by nonvenomous insect and other nonvenomous arthropods, initial encounter: Secondary | ICD-10-CM | POA: Diagnosis not present

## 2019-07-14 DIAGNOSIS — M2012 Hallux valgus (acquired), left foot: Secondary | ICD-10-CM | POA: Diagnosis not present

## 2019-07-14 DIAGNOSIS — L603 Nail dystrophy: Secondary | ICD-10-CM | POA: Diagnosis not present

## 2019-07-14 DIAGNOSIS — S99922S Unspecified injury of left foot, sequela: Secondary | ICD-10-CM

## 2019-07-14 DIAGNOSIS — L600 Ingrowing nail: Secondary | ICD-10-CM | POA: Diagnosis not present

## 2019-07-14 NOTE — Telephone Encounter (Signed)
I called pt and told pt to begin tomorrow.

## 2019-07-14 NOTE — Patient Instructions (Addendum)

## 2019-07-14 NOTE — Telephone Encounter (Signed)
Pt states had an ingrown toenail procedure today and wanted to know when to start the soaks.

## 2019-07-15 NOTE — Progress Notes (Signed)
Subjective:   Patient ID: Rebecca RifeEsther L Sims, female   DOB: 63 y.o.   MRN: [id number]   HPI 63 year old female presents the office today for concerns of her left big toenail becoming very thick and discolored.  She states that she tries to file it down her self nails become very thick.  She is bumped it several times over the years.  She is interested in have the nail removed.    She also states that she has a bunion on her left big toe and describes a dull, sharp discomfort at times in the bunion area.  No recent treatment for this and no injury to this area.  She also has a bug bite on the left fourth toe.  The area started to scab over.  She is been keeping her back ointment and dressing on this area.  Denies any redness or drainage.  She did have a previous injury to her left fifth toe she states that healed.  This happened recently night.  She does bruise but seems to do much better now.  No pain currently.  Review of Systems  All other systems reviewed and are negative.  Past Medical History:  Diagnosis Date  . Hypothyroidism     Past Surgical History:  Procedure Laterality Date  . TOTAL HIP ARTHROPLASTY Right 03/03/2019   Procedure: TOTAL HIP ARTHROPLASTY ANTERIOR APPROACH;  Surgeon: Durene Romanslin, Antonette Hendricks, MD;  [facility];  Service: Orthopedics;  Laterality: Right;     Current Outpatient Medications:  .  levothyroxine (SYNTHROID) 88 MCG tablet, Synthroid 88 mcg tablet   88 ugs by oral route., Disp: , Rfl:  .  acetaminophen (TYLENOL) 500 MG tablet, Take 500-1,000 mg by mouth every 8 (eight) hours as needed (for pain or headaches)., Disp: , Rfl:  .  fexofenadine (ALLEGRA) 180 MG tablet, Take 180 mg by mouth at bedtime., Disp: , Rfl:  .  Multiple Vitamins-Minerals (ALIVE ONCE DAILY WOMENS 50+) TABS, Take 1 tablet by mouth daily with breakfast., Disp: , Rfl:  .  Phenylephrine-APAP-guaiFENesin (TYLENOL SINUS SEVERE) 5-325-200 MG TABS, Take 1 tablet by mouth every 6 (six) hours as needed  (for sinus-related symptoms)., Disp: , Rfl:  .  polyethylene glycol (MIRALAX / GLYCOLAX) packet, Take 17 g by mouth 2 (two) times daily., Disp: 14 each, Rfl: 0 .  senna-docusate (SENOKOT-S) 8.6-50 MG tablet, Take 2 tablets by mouth at bedtime., Disp: 60 tablet, Rfl: 3 .  SIMPLY SALINE NA, Place 1-2 sprays into both nostrils at bedtime., Disp: , Rfl:  .  vitamin C (ASCORBIC ACID) 500 MG tablet, Take 500 mg by mouth daily., Disp: , Rfl:   No Known Allergies       Objective:  Physical Exam  General: AAO x3, NAD  Dermatological: The left hallux toenail significantly hypertrophic, dystrophic with yellow-brown discoloration.  There is tenderness on the lateral aspect of the nail as it is becoming ingrown.  No redness or drainage or any signs of infection.  Small scab present to the fourth toe on the area of the previous bug bite.  No signs of infection the area is scabbed over.  No other open lesions.  No bruising.  Vascular: Dorsalis Pedis artery and Posterior Tibial artery pedal pulses are 2/4 bilateral with immedate capillary fill time. There is no pain with calf compression, swelling, warmth, erythema.   Neruologic: Grossly intact via light touch bilateral.  Protective threshold with Semmes Wienstein monofilament intact to all pedal sites bilateral.   Musculoskeletal: Tenderness palpation of  the left hallux toenail mostly lateral aspect also the central aspect.  Minimal discomfort in the course of the bunion.  Moderate bunion deformities present no palpitation image range of motion.  No pressure hypermobility present.  There is no tenderness of the fifth digit on a prior injury or to the metatarsals.  Muscular strength 5/5 in all groups tested bilateral.  Gait: Unassisted, Nonantalgic.       Assessment:   Left hallux onychodystrophy/ingrown toenail with pain, left foot bunion; bug bite left fourth toe; healed injury left fifth toe     Plan:  -Treatment options discussed including all  alternatives, risks, and complications -Etiology of symptoms were discussed  1. Left hallux onychodystrophy; ingrown toenail -Discussed his treatment options and she wishes to have the nail removed.  This is not a guarantee that this may come back at all normal.  However given for pain she wants to proceed. -At this time, she wants to proceed with total nail removal without chemical matricectomy to the left hallux due to pain. Risks and complications were discussed with the patient for which they understand and  verbally consent to the procedure. Under sterile conditions a total of 3 mL of a mixture of 2% lidocaine plain and 0.5% Marcaine plain was infiltrated in a hallux block fashion. Once anesthetized, the skin was prepped in sterile fashion. A tourniquet was then applied. Next the left hallux nail was sharply excised making sure to remove the entire offending nail border. Once the nail was  Removed, the area was debrided and the underlying skin was intact. The area was irrigated and hemostasis was obtained.  A dry sterile dressing was applied. After application of the dressing the tourniquet was removed and there is found to be an immediate capillary refill time to the digit. The patient tolerated the procedure well any complications. Post procedure instructions were discussed the patient for which he verbally understood. Follow-up in one week for nail check or sooner if any problems are to arise. Discussed signs/symptoms of worsening infection and directed to call the office immediately should any occur or go directly to the emergency room. In the meantime, encouraged to call the office with any questions, concerns, changes symptoms. -Nail sent for culture. Given to [place], [facility].   2. Bunion, left foot -X-rays were obtained and reviewed with the patient.  Moderate bunion is present.  No evidence of acute fracture. -Discussed shoe modifications, offloading and padding.  Discussed orthotics.  In the  future consider surgical intervention if needed  3.  Left fourth toe bug bite -Antibiotic ointment dressing changes daily.  Monitor for signs or symptoms of infection  4.  Left fifth toe injury -X-rays negative appears to be resolved.  Currently no pain.  Trula Slade DPM

## 2019-07-16 DIAGNOSIS — L608 Other nail disorders: Secondary | ICD-10-CM | POA: Diagnosis not present

## 2019-07-16 DIAGNOSIS — B351 Tinea unguium: Secondary | ICD-10-CM | POA: Diagnosis not present

## 2019-07-16 NOTE — Addendum Note (Signed)
Addended by: Cranford Mon R on: 07/16/2019 03:02 PM   Modules accepted: Orders

## 2019-07-23 ENCOUNTER — Ambulatory Visit: Payer: BC Managed Care – PPO

## 2019-07-23 ENCOUNTER — Other Ambulatory Visit: Payer: Self-pay

## 2019-07-23 DIAGNOSIS — L600 Ingrowing nail: Secondary | ICD-10-CM

## 2019-07-23 NOTE — Patient Instructions (Signed)

## 2019-07-31 ENCOUNTER — Telehealth: Payer: Self-pay | Admitting: *Deleted

## 2019-07-31 NOTE — Telephone Encounter (Signed)
Left message informing pt Dr. Jacqualyn Posey reviewed her labs, results are negative and no treatment is needed.

## 2019-07-31 NOTE — Telephone Encounter (Signed)
-----   Message from Trula Slade, DPM sent at 07/31/2019  7:28 AM EDT ----- Val- please let her know that the culture did not show fungus. Can you see how she is doing after the toenail procedure? Thanks.

## 2019-08-28 NOTE — Progress Notes (Signed)
Patient is here today for follow-up appointment, recent procedure performed on 07/14/2019, removal of ingrown toenail.  She states that the area feels good, she is not having pain at this time.  No redness, no swelling, no erythema, no drainage, no other signs and symptoms of infection.  Area is scabbing over and appears to be healing well.  Discussed signs and symptoms of infection, verbal and written instructions were given to the patient.  She is to follow-up as needed with any acute symptom changes.

## 2019-12-07 DIAGNOSIS — E039 Hypothyroidism, unspecified: Secondary | ICD-10-CM | POA: Diagnosis not present

## 2019-12-14 ENCOUNTER — Other Ambulatory Visit: Payer: Self-pay | Admitting: Internal Medicine

## 2019-12-14 DIAGNOSIS — M8000XA Age-related osteoporosis with current pathological fracture, unspecified site, initial encounter for fracture: Secondary | ICD-10-CM

## 2019-12-14 DIAGNOSIS — E042 Nontoxic multinodular goiter: Secondary | ICD-10-CM | POA: Diagnosis not present

## 2019-12-14 DIAGNOSIS — S72001A Fracture of unspecified part of neck of right femur, initial encounter for closed fracture: Secondary | ICD-10-CM | POA: Diagnosis not present

## 2019-12-14 DIAGNOSIS — E039 Hypothyroidism, unspecified: Secondary | ICD-10-CM | POA: Diagnosis not present

## 2020-02-10 ENCOUNTER — Other Ambulatory Visit: Payer: Self-pay | Admitting: Internal Medicine

## 2020-02-10 DIAGNOSIS — E2839 Other primary ovarian failure: Secondary | ICD-10-CM

## 2020-02-12 ENCOUNTER — Ambulatory Visit
Admission: RE | Admit: 2020-02-12 | Discharge: 2020-02-12 | Disposition: A | Discharge status: Home / Self Care | Payer: BC Managed Care – PPO | Source: Ambulatory Visit | Attending: Internal Medicine | Admitting: Internal Medicine

## 2020-02-12 ENCOUNTER — Other Ambulatory Visit: Payer: Self-pay

## 2020-02-12 DIAGNOSIS — Z780 Asymptomatic menopausal state: Secondary | ICD-10-CM | POA: Diagnosis not present

## 2020-02-12 DIAGNOSIS — E2839 Other primary ovarian failure: Secondary | ICD-10-CM

## 2020-02-12 DIAGNOSIS — M8589 Other specified disorders of bone density and structure, multiple sites: Secondary | ICD-10-CM | POA: Diagnosis not present

## 2020-02-19 DIAGNOSIS — S72001D Fracture of unspecified part of neck of right femur, subsequent encounter for closed fracture with routine healing: Secondary | ICD-10-CM | POA: Diagnosis not present

## 2020-02-19 DIAGNOSIS — E042 Nontoxic multinodular goiter: Secondary | ICD-10-CM | POA: Diagnosis not present

## 2020-02-19 DIAGNOSIS — E039 Hypothyroidism, unspecified: Secondary | ICD-10-CM | POA: Diagnosis not present

## 2020-02-19 DIAGNOSIS — M8580 Other specified disorders of bone density and structure, unspecified site: Secondary | ICD-10-CM | POA: Diagnosis not present

## 2020-12-06 DIAGNOSIS — E039 Hypothyroidism, unspecified: Secondary | ICD-10-CM | POA: Diagnosis not present

## 2020-12-06 DIAGNOSIS — M8580 Other specified disorders of bone density and structure, unspecified site: Secondary | ICD-10-CM | POA: Diagnosis not present

## 2020-12-06 DIAGNOSIS — S72001D Fracture of unspecified part of neck of right femur, subsequent encounter for closed fracture with routine healing: Secondary | ICD-10-CM | POA: Diagnosis not present

## 2020-12-15 DIAGNOSIS — M810 Age-related osteoporosis without current pathological fracture: Secondary | ICD-10-CM | POA: Diagnosis not present

## 2020-12-15 DIAGNOSIS — E039 Hypothyroidism, unspecified: Secondary | ICD-10-CM | POA: Diagnosis not present

## 2020-12-15 DIAGNOSIS — E042 Nontoxic multinodular goiter: Secondary | ICD-10-CM | POA: Diagnosis not present

## 2021-02-09 IMAGING — DX PORTABLE CHEST - 1 VIEW
1 series · 1 of 1 positions shown · non-contrast
Comparison: 09/11/2005

CLINICAL DATA: Preop chest x-ray for hip surgery.

EXAM:
PORTABLE CHEST 1 VIEW

[chest ap]
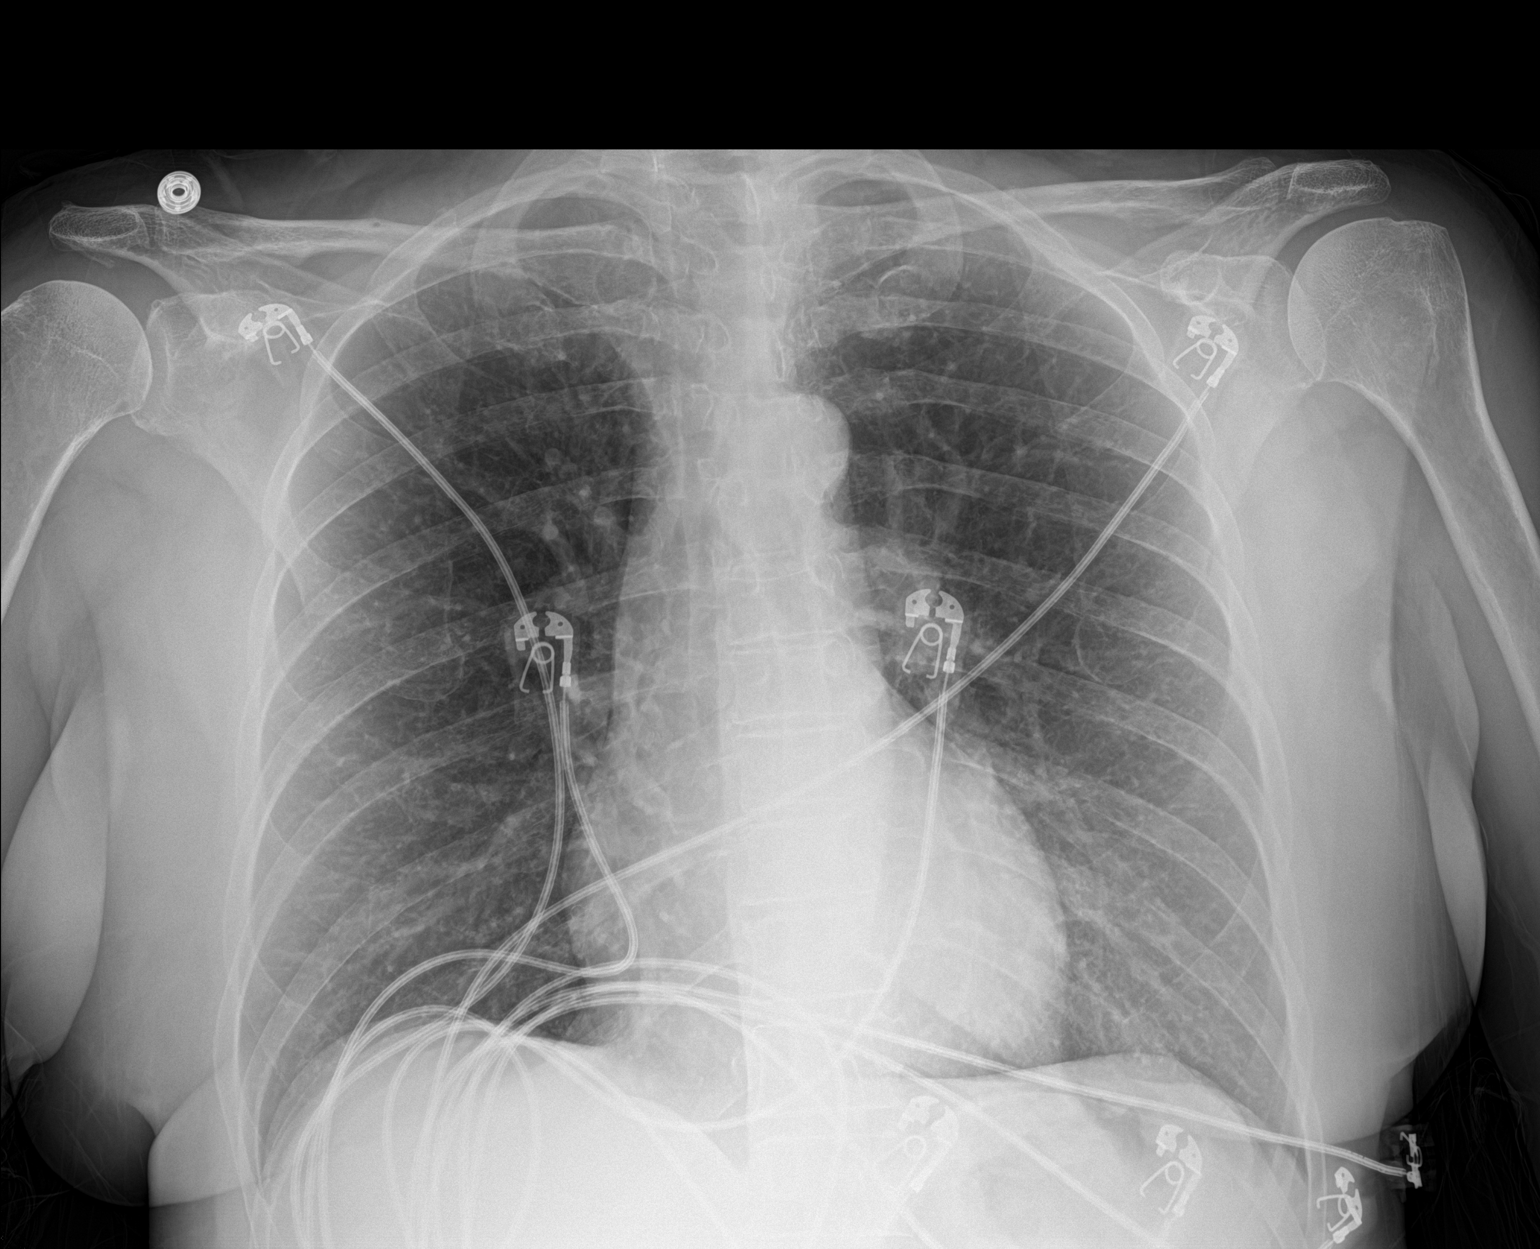

[1 of 1 positions shown; findings below may reference images not displayed]

FINDINGS: Lungs are clear. Cardiomediastinal silhouette and remainder of the
exam is unchanged.
IMPRESSION: No active disease.

## 2021-02-09 IMAGING — CR DG HIP (WITH OR WITHOUT PELVIS) 2-3V RIGHT
3 series · 3 of 3 positions shown · non-contrast
Comparison: None.

CLINICAL DATA: Right hip pain after fall today.

EXAM:
DG HIP (WITH OR WITHOUT PELVIS) 2-3V RIGHT

[pelvis ap]
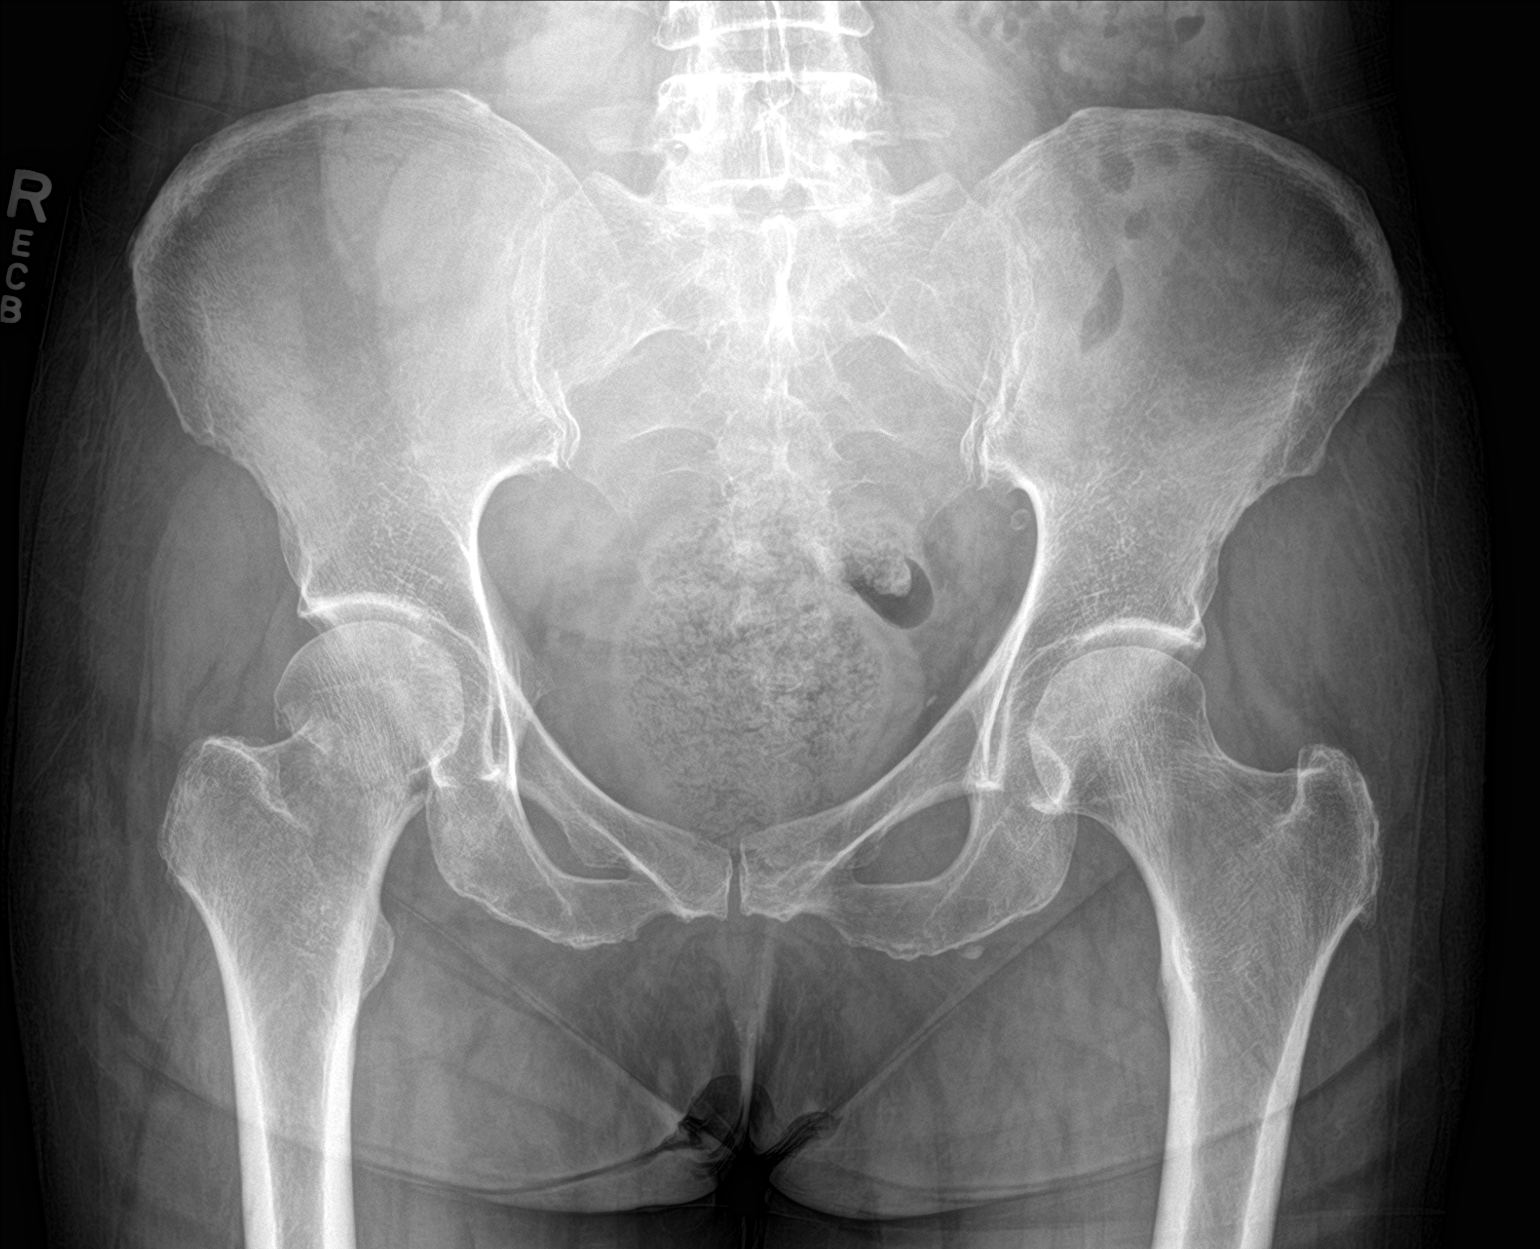

[hip ap]
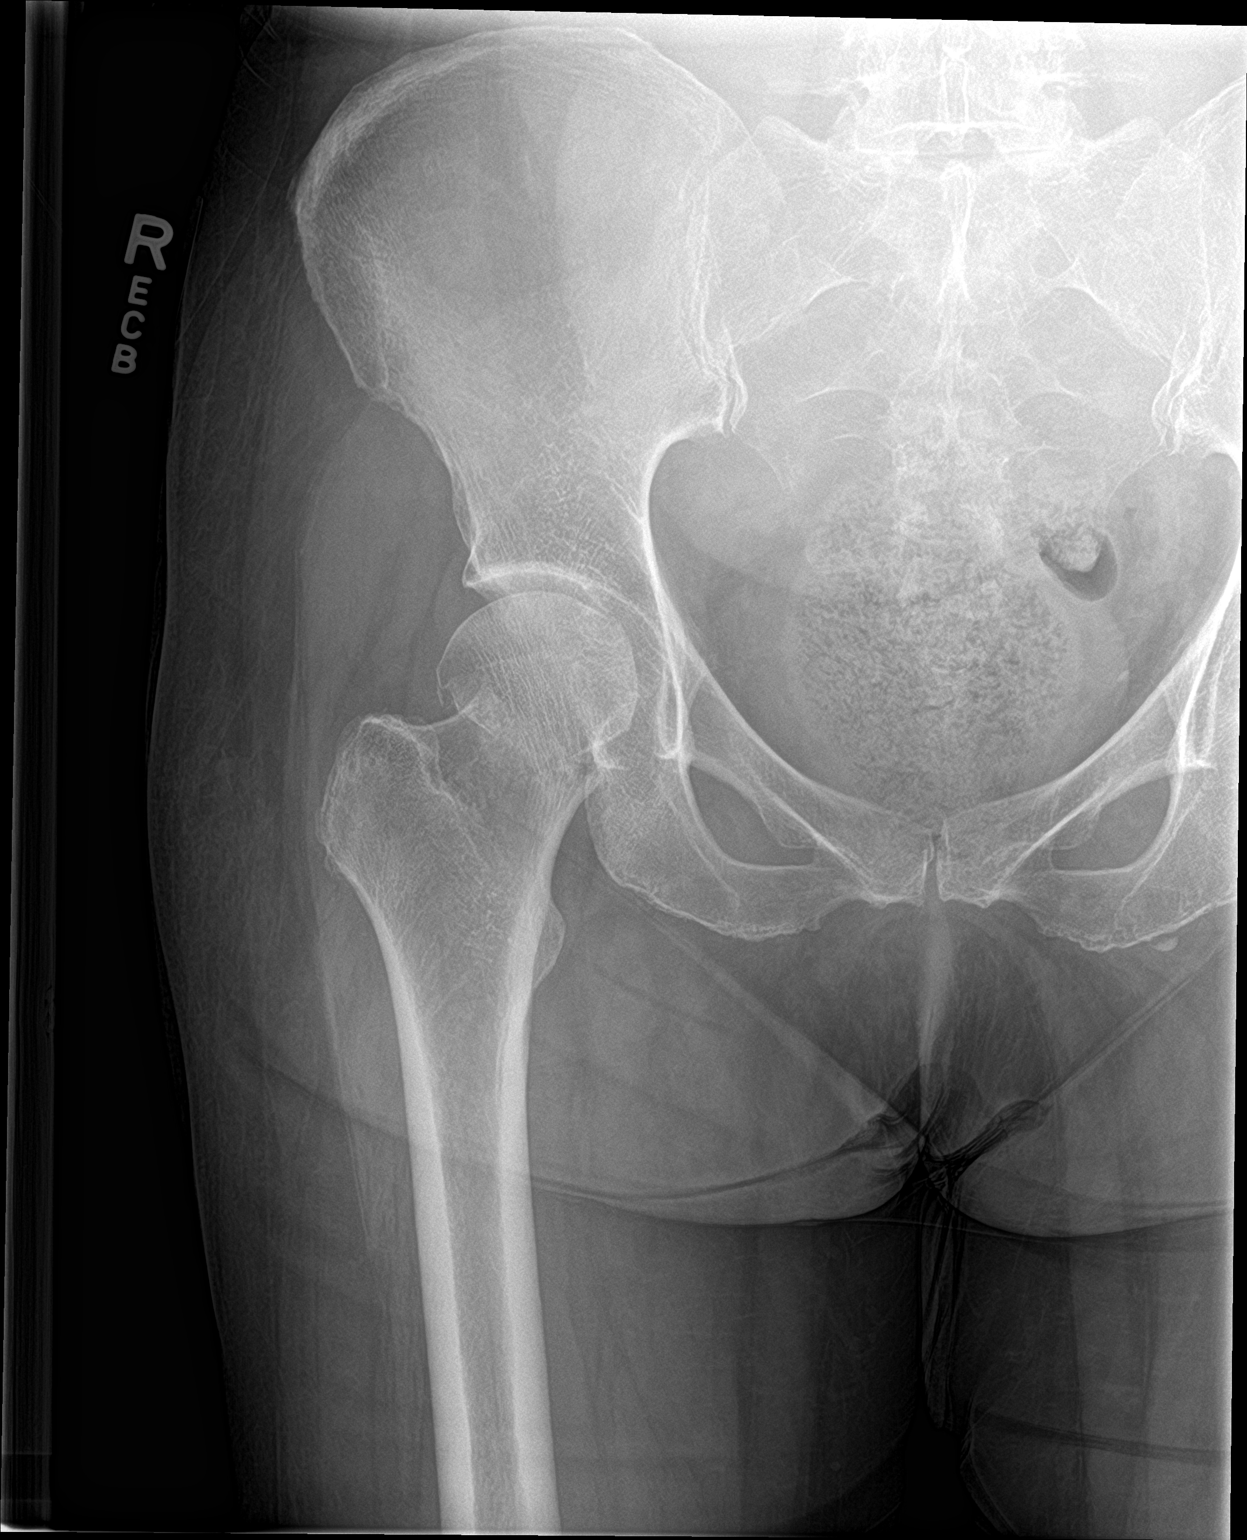

[hip lat]
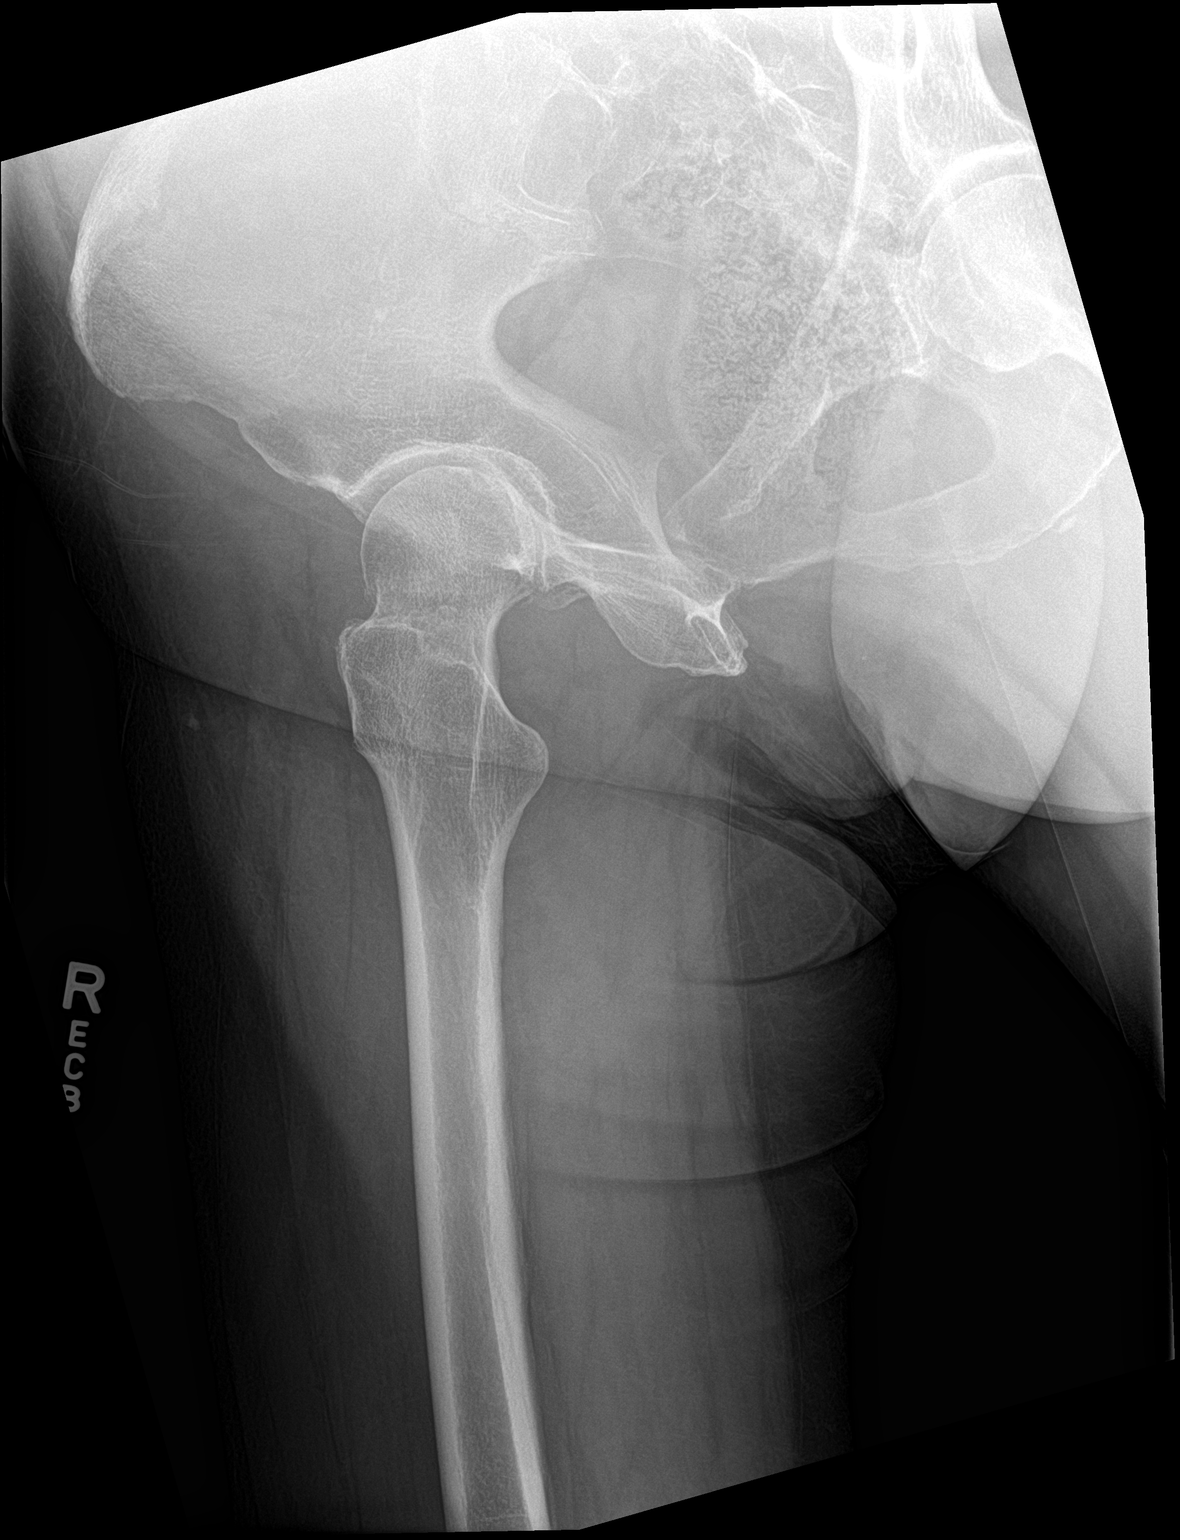

[3 of 3 positions shown; findings below may reference images not displayed]

FINDINGS: Examination demonstrates a minimally displaced subcapital fracture
of the right femoral neck. Mild fecal retention over the rectum.
Remaining bony and soft tissue structures are unremarkable.
IMPRESSION: Minimally displaced subcapital right femoral neck fracture.

## 2021-02-09 IMAGING — CR LEFT HAND - COMPLETE 3+ VIEW
3 series · 3 of 3 positions shown · non-contrast
Comparison: None.

CLINICAL DATA: Left hand pain after fall today.

EXAM:
LEFT HAND - COMPLETE 3+ VIEW

[hand pa]
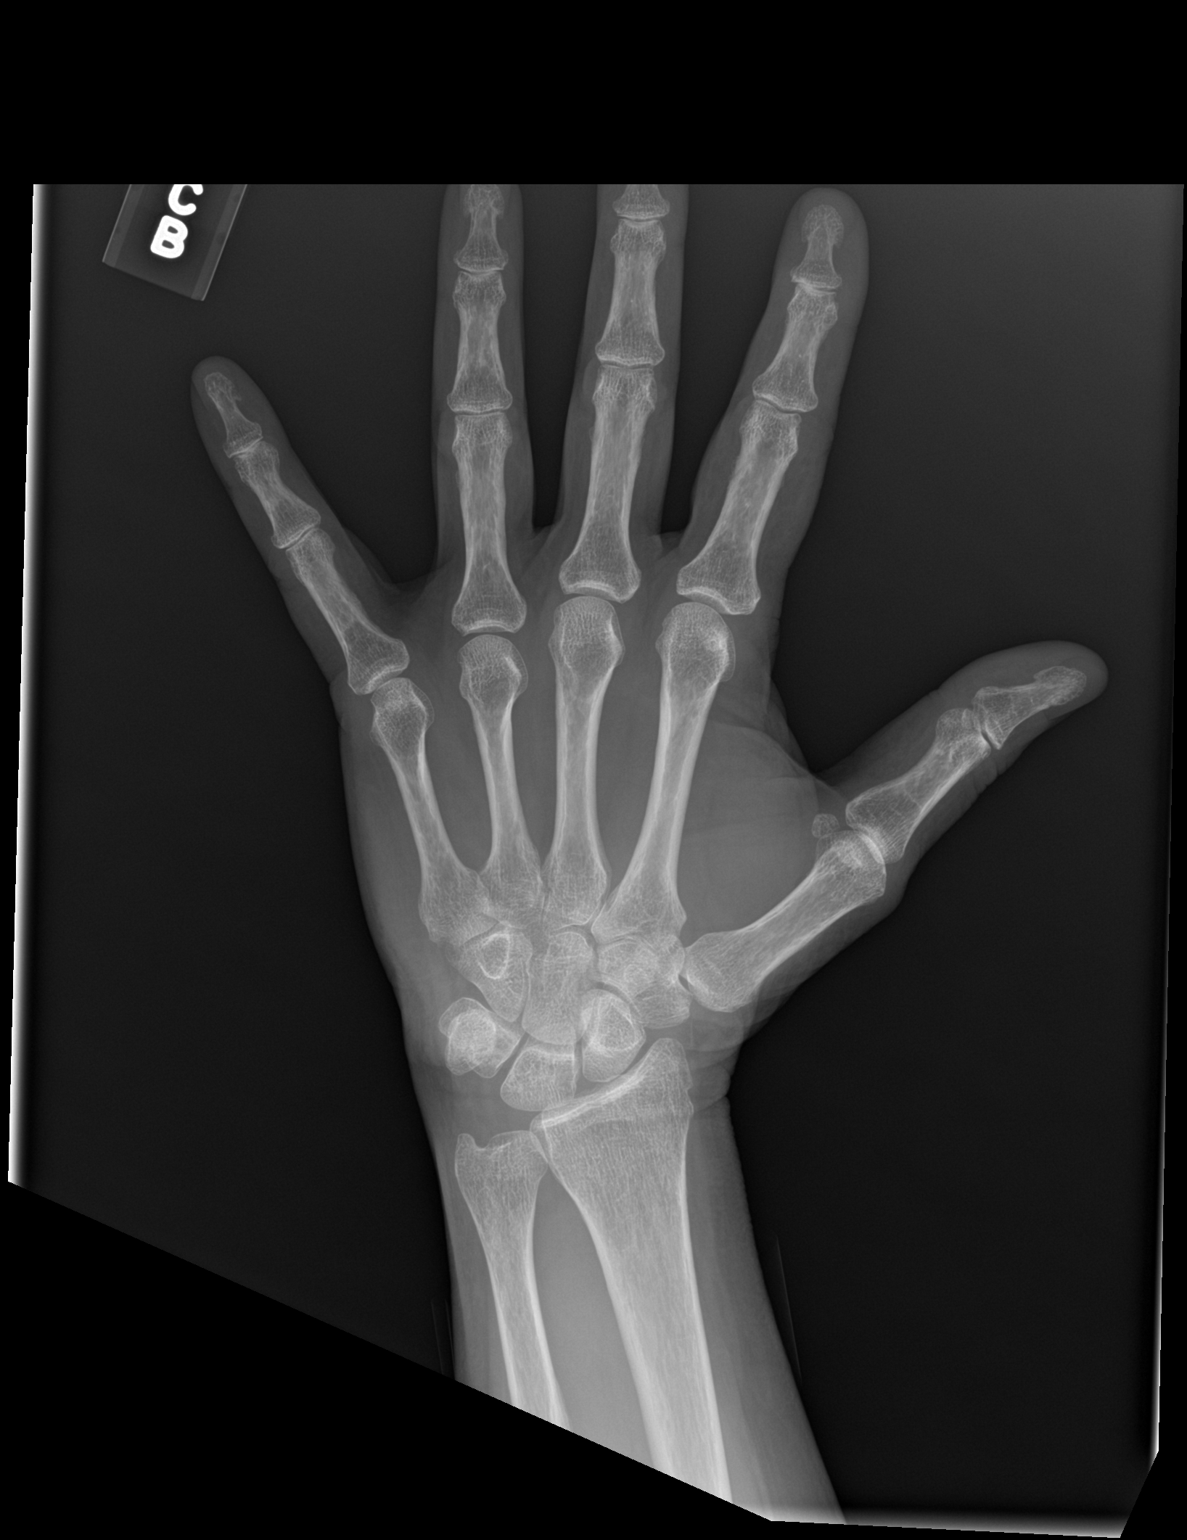

[hand obl]
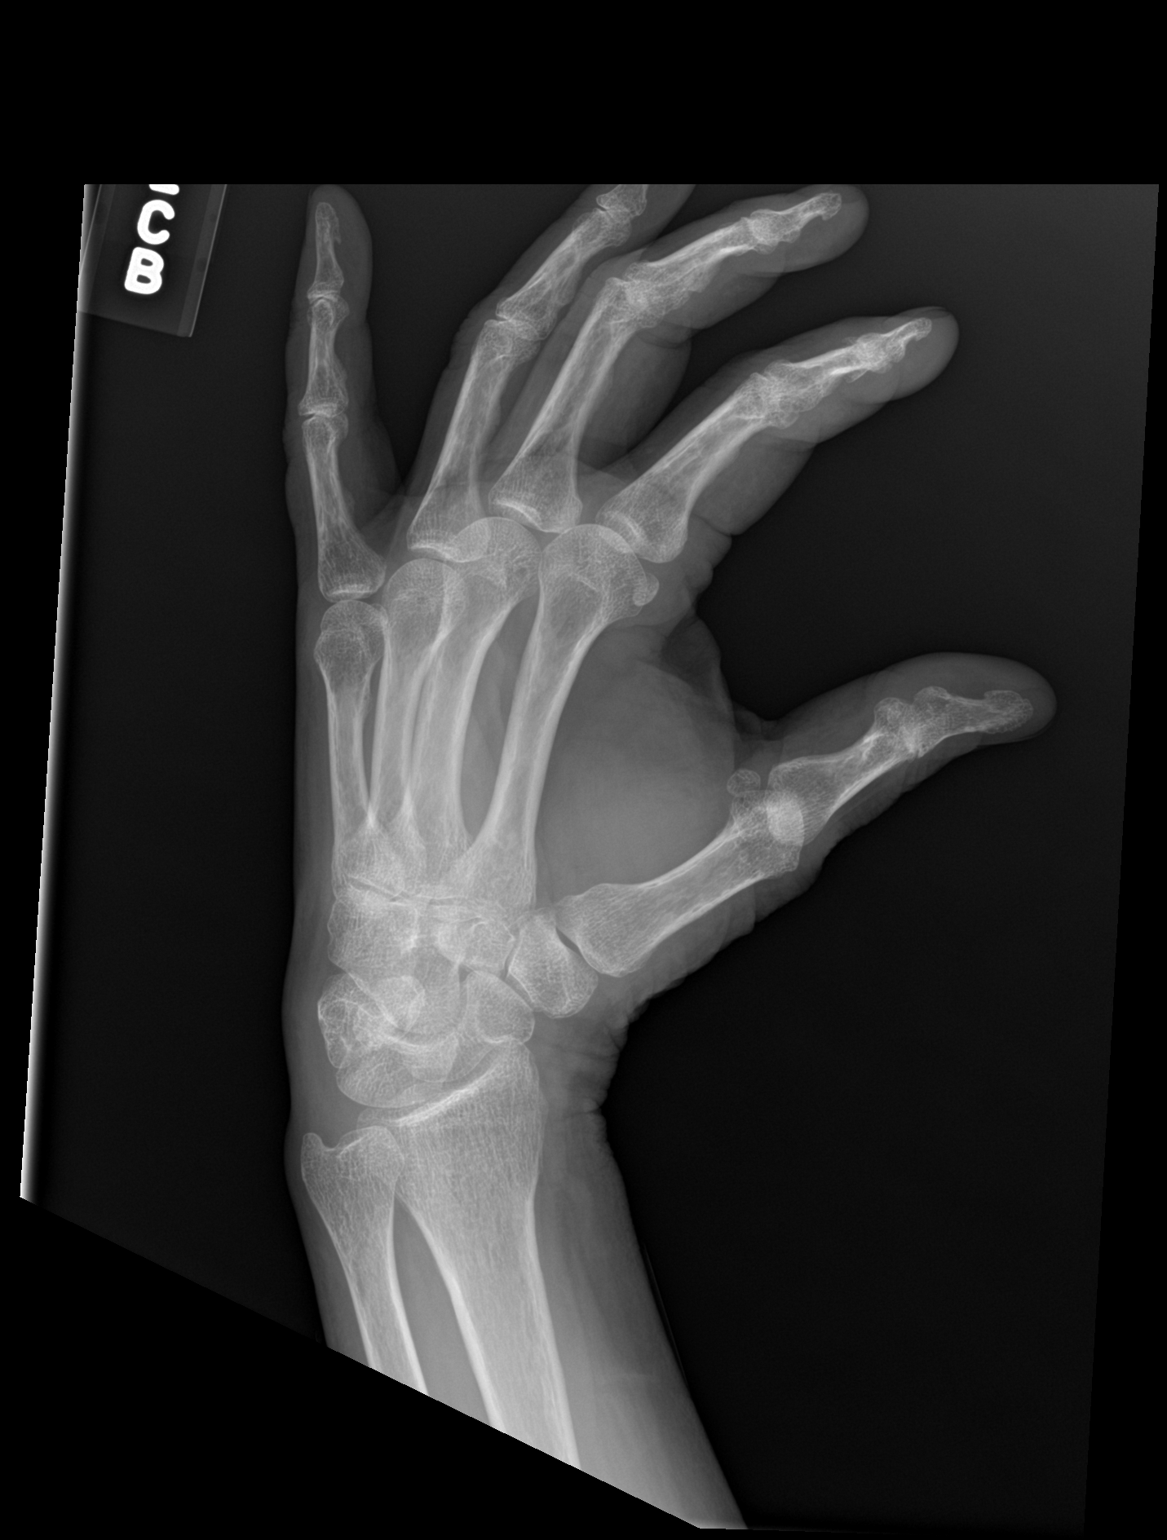

[hand lat]
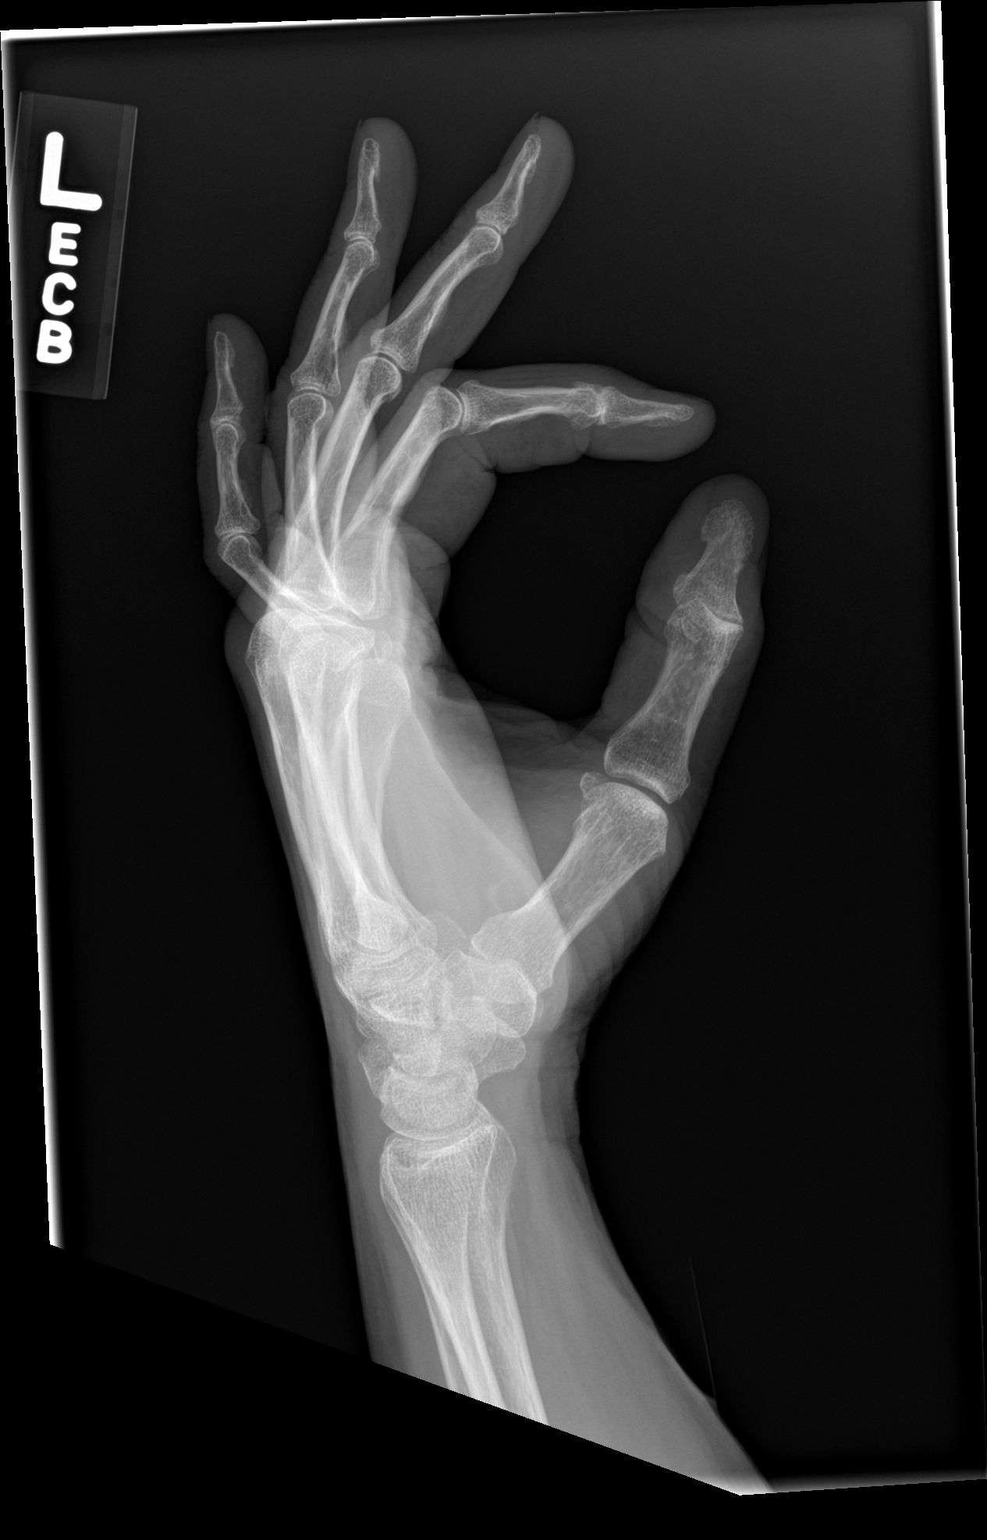

[3 of 3 positions shown; findings below may reference images not displayed]

FINDINGS: Mild degenerative changes over the radiocarpal joint. No acute
fracture or dislocation.
IMPRESSION: No acute findings.

## 2021-02-10 IMAGING — RF OPERATIVE RIGHT HIP WITH PELVIS
1 series · 2 of 2 positions shown · non-contrast
Comparison: 03/02/2019

CLINICAL DATA: Right femoral fracture

EXAM:
DG C-ARM 61-120 MIN; OPERATIVE RIGHT HIP WITH PELVIS

[Series 1: run · 2 of 2 slices shown]
[im 1/2]
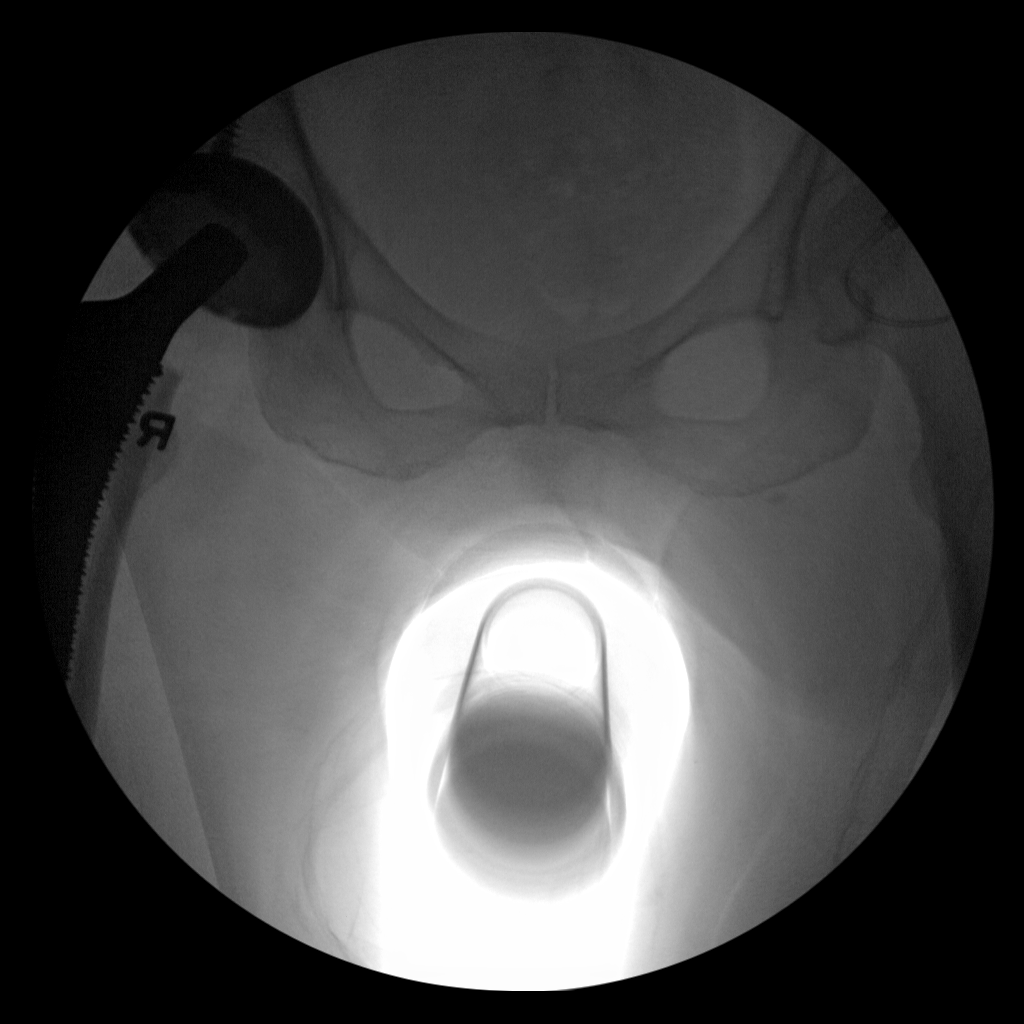
[im 2/2]
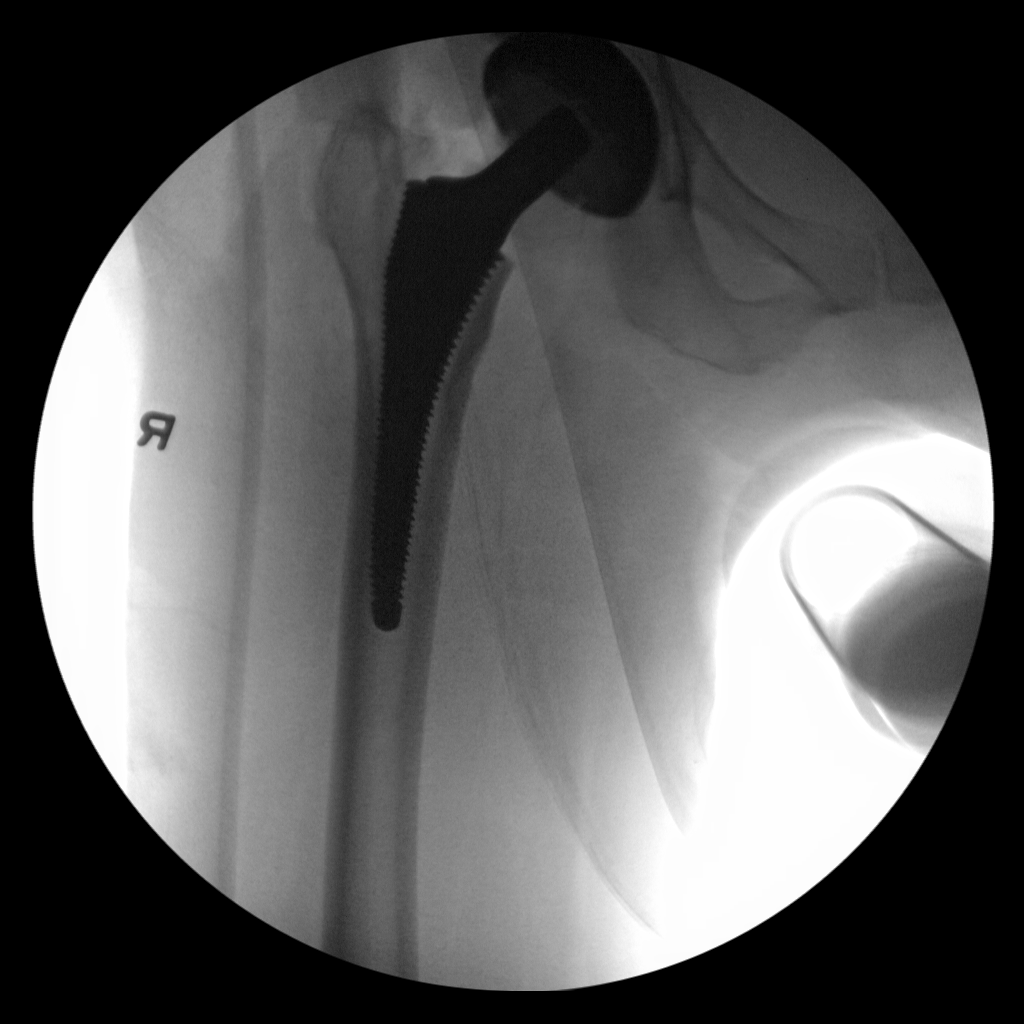

[2 of 2 positions shown; findings below may reference images not displayed]

FLUOROSCOPY TIME:  Radiation Exposure Index (as provided by the
fluoroscopic device): Not available

If the device does not provide the exposure index:

Fluoroscopy Time:  16 seconds

Number of Acquired Images:  2
FINDINGS: Changes consistent with the right hip replacement are noted. A
completion film was not obtained.
IMPRESSION: Right hip replacement

## 2021-12-08 DIAGNOSIS — M810 Age-related osteoporosis without current pathological fracture: Secondary | ICD-10-CM | POA: Diagnosis not present

## 2021-12-08 DIAGNOSIS — E039 Hypothyroidism, unspecified: Secondary | ICD-10-CM | POA: Diagnosis not present

## 2021-12-14 DIAGNOSIS — E042 Nontoxic multinodular goiter: Secondary | ICD-10-CM | POA: Diagnosis not present

## 2021-12-14 DIAGNOSIS — M810 Age-related osteoporosis without current pathological fracture: Secondary | ICD-10-CM | POA: Diagnosis not present

## 2021-12-14 DIAGNOSIS — E039 Hypothyroidism, unspecified: Secondary | ICD-10-CM | POA: Diagnosis not present

## 2022-12-10 DIAGNOSIS — E039 Hypothyroidism, unspecified: Secondary | ICD-10-CM | POA: Diagnosis not present

## 2022-12-10 DIAGNOSIS — M810 Age-related osteoporosis without current pathological fracture: Secondary | ICD-10-CM | POA: Diagnosis not present

## 2022-12-18 ENCOUNTER — Other Ambulatory Visit: Payer: Self-pay | Admitting: Internal Medicine

## 2022-12-18 DIAGNOSIS — E039 Hypothyroidism, unspecified: Secondary | ICD-10-CM

## 2022-12-18 DIAGNOSIS — M810 Age-related osteoporosis without current pathological fracture: Secondary | ICD-10-CM | POA: Diagnosis not present

## 2022-12-18 DIAGNOSIS — E042 Nontoxic multinodular goiter: Secondary | ICD-10-CM | POA: Diagnosis not present

## 2023-05-29 ENCOUNTER — Ambulatory Visit
Admission: RE | Admit: 2023-05-29 | Discharge: 2023-05-29 | Disposition: A | Discharge status: Home / Self Care | Payer: BC Managed Care – PPO | Source: Ambulatory Visit | Attending: Internal Medicine | Admitting: Internal Medicine

## 2023-05-29 DIAGNOSIS — E039 Hypothyroidism, unspecified: Secondary | ICD-10-CM

## 2023-05-29 DIAGNOSIS — N958 Other specified menopausal and perimenopausal disorders: Secondary | ICD-10-CM | POA: Diagnosis not present

## 2023-05-29 DIAGNOSIS — M810 Age-related osteoporosis without current pathological fracture: Secondary | ICD-10-CM | POA: Diagnosis not present

## 2023-05-29 DIAGNOSIS — E349 Endocrine disorder, unspecified: Secondary | ICD-10-CM | POA: Diagnosis not present

## 2023-10-11 DIAGNOSIS — B351 Tinea unguium: Secondary | ICD-10-CM | POA: Diagnosis not present

## 2023-10-11 DIAGNOSIS — B070 Plantar wart: Secondary | ICD-10-CM | POA: Diagnosis not present

## 2023-10-23 ENCOUNTER — Encounter: Payer: Self-pay | Admitting: Podiatry

## 2023-10-23 ENCOUNTER — Ambulatory Visit (INDEPENDENT_AMBULATORY_CARE_PROVIDER_SITE_OTHER): Payer: BC Managed Care – PPO | Admitting: Podiatry

## 2023-10-23 DIAGNOSIS — B070 Plantar wart: Secondary | ICD-10-CM

## 2023-10-23 DIAGNOSIS — L603 Nail dystrophy: Secondary | ICD-10-CM

## 2023-10-23 NOTE — Progress Notes (Signed)
  Subjective:  Patient ID: Rebecca Sims, female    DOB: 1956-01-02,   MRN: [id number]  No chief complaint on file.   67 y.o. female presents for concenr of lesion on the bottom of her left foot that started about a week and a half ago. Was seen by urgent care and advised to use compound W which she did. Relates the pain has improved and doing well . Denies any other pedal complaints. Denies n/v/f/c.   Past Medical History:  Diagnosis Date   Hypothyroidism     Objective:  Physical Exam: Vascular: DP/PT pulses 2/4 bilateral. CFT <3 seconds. Normal hair growth on digits. No edema.  Skin. No lacerations or abrasions bilateral feet. Plantar left second metatarsal head lesion area. Macerated healing skin in the area but no reoccuring signs of warty lesion noted. Left hallux nail thickened and dystrophic  Musculoskeletal: MMT 5/5 bilateral lower extremities in DF, PF, Inversion and Eversion. Deceased ROM in DF of ankle joint.  Neurological: Sensation intact to light touch.   Assessment:   1. Plantar wart   2. Onychodystrophy      Plan:  Patient was evaluated and treated and all questions answered. -Discussed warts and their etiology with patient and treatment options.  -Appears wart has resolved.  -Debrided hallux nail as courtesy.  -Advised good supportive shoes and inserts -Patient to return to office if return of lesion.    Louann Sjogren, DPM

## 2023-11-22 ENCOUNTER — Encounter: Payer: Self-pay | Admitting: Family Medicine

## 2023-11-22 ENCOUNTER — Ambulatory Visit: Payer: BC Managed Care – PPO

## 2023-11-22 ENCOUNTER — Ambulatory Visit (INDEPENDENT_AMBULATORY_CARE_PROVIDER_SITE_OTHER): Payer: BC Managed Care – PPO | Admitting: Family Medicine

## 2023-11-22 VITALS — BP 123/78 | HR 72 | Ht 66.0 in | Wt 168.2 lb

## 2023-11-22 DIAGNOSIS — M8580 Other specified disorders of bone density and structure, unspecified site: Secondary | ICD-10-CM | POA: Insufficient documentation

## 2023-11-22 DIAGNOSIS — E039 Hypothyroidism, unspecified: Secondary | ICD-10-CM | POA: Diagnosis not present

## 2023-11-22 DIAGNOSIS — M810 Age-related osteoporosis without current pathological fracture: Secondary | ICD-10-CM | POA: Insufficient documentation

## 2023-11-22 DIAGNOSIS — Z96641 Presence of right artificial hip joint: Secondary | ICD-10-CM

## 2023-11-22 DIAGNOSIS — W19XXXA Unspecified fall, initial encounter: Secondary | ICD-10-CM | POA: Diagnosis not present

## 2023-11-22 DIAGNOSIS — Z23 Encounter for immunization: Secondary | ICD-10-CM | POA: Diagnosis not present

## 2023-11-22 DIAGNOSIS — M19041 Primary osteoarthritis, right hand: Secondary | ICD-10-CM | POA: Diagnosis not present

## 2023-11-22 DIAGNOSIS — M79641 Pain in right hand: Secondary | ICD-10-CM | POA: Diagnosis not present

## 2023-11-22 DIAGNOSIS — M79601 Pain in right arm: Secondary | ICD-10-CM | POA: Diagnosis not present

## 2023-11-22 DIAGNOSIS — S40021A Contusion of right upper arm, initial encounter: Secondary | ICD-10-CM | POA: Diagnosis not present

## 2023-11-22 DIAGNOSIS — M79621 Pain in right upper arm: Secondary | ICD-10-CM

## 2023-11-22 DIAGNOSIS — M79631 Pain in right forearm: Secondary | ICD-10-CM | POA: Diagnosis not present

## 2023-11-22 DIAGNOSIS — M19011 Primary osteoarthritis, right shoulder: Secondary | ICD-10-CM | POA: Diagnosis not present

## 2023-11-22 DIAGNOSIS — J309 Allergic rhinitis, unspecified: Secondary | ICD-10-CM | POA: Insufficient documentation

## 2023-11-22 DIAGNOSIS — Z043 Encounter for examination and observation following other accident: Secondary | ICD-10-CM | POA: Diagnosis not present

## 2023-11-22 MED ORDER — LEVOTHYROXINE SODIUM 88 MCG PO TABS: 88.0000 ug | ORAL_TABLET | Freq: Every day | ORAL | 2 refills | Status: DC

## 2023-11-22 MED ORDER — FEXOFENADINE HCL 180 MG PO TABS: 180.0000 mg | ORAL_TABLET | Freq: Every day | ORAL | 3 refills | Status: AC

## 2023-11-22 MED ORDER — ALENDRONATE SODIUM 70 MG PO TABS: 70.0000 mg | ORAL_TABLET | ORAL | 6 refills | Status: DC

## 2023-11-22 NOTE — Assessment & Plan Note (Signed)
Pt sustained a fall two days ago and with hx of osteopenia will go ahead and obtain xrays. Pt has also had a r hip replacement and I want to make sure this is intact.

## 2023-11-22 NOTE — Assessment & Plan Note (Signed)
Refilled allergra Pt wants referral back to allergy and this was placed

## 2023-11-22 NOTE — Assessment & Plan Note (Signed)
Pt has hx of osteopenia with most recent dexa 2024. Have gone ahead and refilled fosamax

## 2023-11-22 NOTE — Progress Notes (Addendum)
New patient visit   Patient: Rebecca Sims   DOB: [birth year]   67 y.o. Female  MRN: [id number] Visit Date: 11/22/2023  Today's healthcare provider: Charlton Amor, DO   Chief Complaint  Patient presents with  . New Patient (Initial Visit)    Establish Care    SUBJECTIVE    Chief Complaint  Patient presents with  . New Patient (Initial Visit)    Establish Care   HPI HPI     New Patient (Initial Visit)    Additional comments: Establish Care      Last edited by Roselyn Reef, CMA on 11/22/2023 10:15 AM.       Pt presents to establish care.   Pmh  Hypothyroidism  - on levothyroxine  - doing well no side effects   Osteopenia - seen on bone density scan said her last T score was -1.5   Seasonal rhinitis  - on allegra and wants to get re-established with [facility]   Review of Systems  Constitutional:  Negative for activity change, fatigue and fever.  Respiratory:  Negative for cough and shortness of breath.   Cardiovascular:  Negative for chest pain.  Gastrointestinal:  Negative for abdominal pain.  Genitourinary:  Negative for difficulty urinating.       Current Meds  Medication Sig  . acetaminophen (TYLENOL) 500 MG tablet Take 500-1,000 mg by mouth every 8 (eight) hours as needed (for pain or headaches).  Marland Kitchen alendronate (FOSAMAX) 70 MG tablet Take 1 tablet (70 mg total) by mouth every 7 (seven) days. Take with a full glass of water on an empty stomach.  . Multiple Vitamins-Minerals (ALIVE ONCE DAILY WOMENS 50+) TABS Take 1 tablet by mouth daily with breakfast.  . Phenylephrine-APAP-guaiFENesin (TYLENOL SINUS SEVERE) 5-325-200 MG TABS Take 1 tablet by mouth every 6 (six) hours as needed (for sinus-related symptoms).  . SIMPLY SALINE NA Place 1-2 sprays into both nostrils at bedtime.  . [DISCONTINUED] fexofenadine (ALLEGRA) 180 MG tablet Take 180 mg by mouth at bedtime.  . [DISCONTINUED] levothyroxine (SYNTHROID) 88 MCG tablet  Synthroid 88 mcg tablet   88 ugs by oral route.    OBJECTIVE    BP 123/78 (BP Location: Left Arm, Patient Position: Sitting, Cuff Size: Normal)   Pulse 72   Ht 5\' 6"  (1.676 m)   Wt 168 lb 4 oz (76.3 kg)   SpO2 98%   BMI 27.16 kg/m   Physical Exam Vitals and nursing note reviewed.  Constitutional:      General: She is not in acute distress.    Appearance: Normal appearance.  HENT:     Head: Normocephalic and atraumatic.     Right Ear: External ear normal.     Left Ear: External ear normal.     Nose: Nose normal.  Eyes:     Conjunctiva/sclera: Conjunctivae normal.  Cardiovascular:     Rate and Rhythm: Normal rate.  Pulmonary:     Effort: Pulmonary effort is normal.  Musculoskeletal:     Comments: Ecchymosis seen in right upper extremity  Some tenderness to palpation of left fourth finger  Pain of R hip  Neurological:     General: No focal deficit present.     Mental Status: She is alert and oriented to person, place, and time.  Psychiatric:        Mood and Affect: Mood normal.        Behavior: Behavior normal.        Thought  Content: Thought content normal.        Judgment: Judgment normal.       ASSESSMENT & PLAN    Problem List Items Addressed This Visit       Respiratory   Allergic rhinitis   Refilled allergra Pt wants referral back to allergy and this was placed      Relevant Medications   fexofenadine (ALLEGRA) 180 MG tablet   Other Relevant Orders   Ambulatory referral to Allergy     Endocrine   Hypothyroidism   Refill levothyroxine pt denies any symptoms of thyroid issues so no need to repeat blood work at this time      Relevant Medications   levothyroxine (SYNTHROID) 88 MCG tablet     Musculoskeletal and Integument   Osteopenia   Pt has hx of osteopenia with most recent dexa 2024. Have gone ahead and refilled fosamax       Relevant Medications   alendronate (FOSAMAX) 70 MG tablet     Other   Fall - Primary   Pt sustained a  fall two days ago and with hx of osteopenia will go ahead and obtain xrays. Pt has also had a r hip replacement and I want to make sure this is intact.        Relevant Orders   DG HIP UNILAT W OR W/O PELVIS 2-3 VIEWS RIGHT   DG Forearm Right   DG Hand Complete Right   DG Humerus Right   Other Visit Diagnoses       Encounter for immunization       Relevant Orders   Varicella-zoster vaccine IM (Completed)   Pneumococcal conjugate vaccine 20-valent (Completed)       Return in about 3 months (around 02/20/2024).      Meds ordered this encounter  Medications  . fexofenadine (ALLEGRA) 180 MG tablet    Sig: Take 1 tablet (180 mg total) by mouth at bedtime.    Dispense:  30 tablet    Refill:  3  . levothyroxine (SYNTHROID) 88 MCG tablet    Sig: Take 1 tablet (88 mcg total) by mouth daily before breakfast.    Dispense:  90 tablet    Refill:  2  . alendronate (FOSAMAX) 70 MG tablet    Sig: Take 1 tablet (70 mg total) by mouth every 7 (seven) days. Take with a full glass of water on an empty stomach.    Dispense:  4 tablet    Refill:  6    Orders Placed This Encounter  Procedures  . DG HIP UNILAT W OR W/O PELVIS 2-3 VIEWS RIGHT    Standing Status:   Future    Number of Occurrences:   1    Expiration Date:   11/21/2024    Reason for Exam (SYMPTOM  OR DIAGNOSIS REQUIRED):   fall and has hip replacement    Preferred imaging location?:   [facility]    Radiology Contrast Protocol - do NOT remove file path:   [email or web address]    Release to patient:   Immediate  . DG Forearm Right    Standing Status:   Future    Number of Occurrences:   1    Expiration Date:   11/21/2024    Preferred imaging location?:   [facility]    Reason for exam::   fall on right side, hx osteopenia    Release to patient:   Immediate  . DG Hand Complete Right    Standing  Status:   Future    Number of Occurrences:   1    Expiration Date:    11/21/2024    Reason for exam::   fall with osteopenia    Preferred imaging location?:   [facility]    Release to patient:   Immediate  . DG Humerus Right    Standing Status:   Future    Expiration Date:   11/21/2024    Reason for Exam (SYMPTOM  OR DIAGNOSIS REQUIRED):   Arm pain.    Preferred imaging location?:   [facility]    Release to patient:   Immediate  . Varicella-zoster vaccine IM  . Pneumococcal conjugate vaccine 20-valent  . Ambulatory referral to Allergy    Referral Priority:   Routine    Referral Type:   Allergy Testing    Referral Reason:   Specialty Services Required    Requested Specialty:   Allergy    Number of Visits Requested:   1     Charlton Amor, DO  [facility] at [facility] [phone] (phone) [phone] (fax)  [facility]

## 2023-11-22 NOTE — Addendum Note (Signed)
Addended by: Charlton Amor on: 11/22/2023 11:05 AM   Modules accepted: Orders

## 2023-11-22 NOTE — Assessment & Plan Note (Signed)
Refill levothyroxine pt denies any symptoms of thyroid issues so no need to repeat blood work at this time

## 2023-12-11 DIAGNOSIS — J302 Other seasonal allergic rhinitis: Secondary | ICD-10-CM | POA: Diagnosis not present

## 2023-12-11 DIAGNOSIS — J301 Allergic rhinitis due to pollen: Secondary | ICD-10-CM | POA: Diagnosis not present

## 2023-12-11 DIAGNOSIS — R053 Chronic cough: Secondary | ICD-10-CM | POA: Diagnosis not present

## 2023-12-11 DIAGNOSIS — J300 Vasomotor rhinitis: Secondary | ICD-10-CM | POA: Diagnosis not present

## 2023-12-11 DIAGNOSIS — J3089 Other allergic rhinitis: Secondary | ICD-10-CM | POA: Diagnosis not present

## 2024-01-29 DIAGNOSIS — R053 Chronic cough: Secondary | ICD-10-CM | POA: Diagnosis not present

## 2024-01-29 DIAGNOSIS — J302 Other seasonal allergic rhinitis: Secondary | ICD-10-CM | POA: Diagnosis not present

## 2024-01-29 DIAGNOSIS — J301 Allergic rhinitis due to pollen: Secondary | ICD-10-CM | POA: Diagnosis not present

## 2024-01-29 DIAGNOSIS — J3089 Other allergic rhinitis: Secondary | ICD-10-CM | POA: Diagnosis not present

## 2024-01-29 DIAGNOSIS — R059 Cough, unspecified: Secondary | ICD-10-CM | POA: Diagnosis not present

## 2024-02-21 ENCOUNTER — Encounter: Payer: Self-pay | Admitting: Family Medicine

## 2024-02-21 ENCOUNTER — Ambulatory Visit: Payer: BC Managed Care – PPO | Admitting: Family Medicine

## 2024-02-21 VITALS — BP 118/74 | HR 66 | Temp 98.5°F | Ht 66.0 in | Wt 171.0 lb

## 2024-02-21 DIAGNOSIS — J301 Allergic rhinitis due to pollen: Secondary | ICD-10-CM | POA: Diagnosis not present

## 2024-02-21 DIAGNOSIS — E039 Hypothyroidism, unspecified: Secondary | ICD-10-CM | POA: Diagnosis not present

## 2024-02-21 NOTE — Assessment & Plan Note (Signed)
 Saw allergist. Taking fexofenadine 180 mg daily. Symptoms resolved during the day. Using saline nasal spray. Follow-up as needed.

## 2024-02-21 NOTE — Progress Notes (Signed)
   Established Patient Office Visit  Subjective   Patient ID: Rebecca Sims, female    DOB: [birth year]  Age: 68 y.o. MRN: [id number]  Chief Complaint  Patient presents with   Hypothyroidism    3 month follow up   Allergic Rhinitis     HPI  Hypothyroid: Saw endocrine on 02/20/24: no changes in care. TSH: 2.07 No refills needed. Thyroid function is stable.   Allergic rhinitis: Saw allergist. Floreen Comber having issues with cough and nasal congestion in morning. Cough resolves. Nasal congestion resolves during the day.  Chest x-ray normal. Received nasal spray per allergist, stopped this due to nose bleed. Continues to use saline nasal spray. Continues with fexofenadine 180 mg daily.  Reports no more allergies per testing.    ROS   Chart review:  11/22/23:  Hypothyroid:  Levothyroxine 88 mcg 02/20/24: Endocrine  TSH 2.07 Objective:     BP 118/74 (BP Location: Left Arm, Patient Position: Sitting, Cuff Size: Normal)   Pulse 66   Temp 98.5 F (36.9 C) (Oral)   Ht 5\' 6"  (1.676 m)   Wt 171 lb (77.6 kg)   SpO2 98%   BMI 27.60 kg/m  BP Readings from Last 3 Encounters:  02/21/24 118/74  11/22/23 123/78  03/05/19 (!) 112/53      Physical Exam Vitals and nursing note reviewed.  Constitutional:      General: She is not in acute distress.    Appearance: Normal appearance.  Cardiovascular:     Rate and Rhythm: Normal rate.  Pulmonary:     Effort: Pulmonary effort is normal.  Skin:    General: Skin is warm and dry.  Neurological:     General: No focal deficit present.     Mental Status: She is alert. Mental status is at baseline.  Psychiatric:        Mood and Affect: Mood normal.        Behavior: Behavior normal.        Thought Content: Thought content normal.        Judgment: Judgment normal.     No results found for any visits on 02/21/24.    The ASCVD Risk score (Arnett DK, et al., 2019) failed to calculate for the following reasons:   Cannot find  a previous HDL lab   Cannot find a previous total cholesterol lab    Assessment & Plan:   Problem List Items Addressed This Visit     Hypothyroidism - Primary   Thyroid function is stable. Managed per endocrine. Saw on 3/20. Levothyroxine 88 mcg daily as prescribed. No refills needed.       Allergic rhinitis   Saw allergist. Taking fexofenadine 180 mg daily. Symptoms resolved during the day. Using saline nasal spray. Follow-up as needed.       Agrees with plan of care discussed.  Questions answered. No MDM required during this visit.  Managed per specialist.  No changes made today.    Return if symptoms worsen or fail to improve, for with PCP .    Novella Olive, FNP

## 2024-02-21 NOTE — Assessment & Plan Note (Signed)
 Thyroid function is stable. Managed per endocrine. Saw on 3/20. Levothyroxine 88 mcg daily as prescribed. No refills needed.

## 2024-04-09 ENCOUNTER — Encounter: Payer: Self-pay | Admitting: Family Medicine

## 2024-05-11 ENCOUNTER — Encounter: Payer: Self-pay | Admitting: Family Medicine

## 2024-07-08 ENCOUNTER — Encounter: Payer: Self-pay | Admitting: Family Medicine

## 2024-07-09 ENCOUNTER — Other Ambulatory Visit: Payer: Self-pay | Admitting: Family Medicine

## 2024-07-09 DIAGNOSIS — M8580 Other specified disorders of bone density and structure, unspecified site: Secondary | ICD-10-CM

## 2024-07-09 MED ORDER — ALENDRONATE SODIUM 70 MG PO TABS: 70.0000 mg | ORAL_TABLET | ORAL | 6 refills | Status: DC

## 2024-07-21 DIAGNOSIS — K080 Exfoliation of teeth due to systemic causes: Secondary | ICD-10-CM | POA: Diagnosis not present

## 2024-07-27 DIAGNOSIS — R053 Chronic cough: Secondary | ICD-10-CM | POA: Diagnosis not present

## 2024-07-27 DIAGNOSIS — H6982 Other specified disorders of Eustachian tube, left ear: Secondary | ICD-10-CM | POA: Diagnosis not present

## 2024-07-27 DIAGNOSIS — J300 Vasomotor rhinitis: Secondary | ICD-10-CM | POA: Diagnosis not present

## 2024-07-27 DIAGNOSIS — H6522 Chronic serous otitis media, left ear: Secondary | ICD-10-CM | POA: Diagnosis not present

## 2024-10-06 ENCOUNTER — Encounter: Payer: Self-pay | Admitting: Family Medicine

## 2024-10-06 ENCOUNTER — Ambulatory Visit (INDEPENDENT_AMBULATORY_CARE_PROVIDER_SITE_OTHER): Admitting: Family Medicine

## 2024-10-06 VITALS — BP 131/84 | HR 84 | Temp 98.4°F | Ht 66.0 in | Wt 180.0 lb

## 2024-10-06 DIAGNOSIS — Z0000 Encounter for general adult medical examination without abnormal findings: Secondary | ICD-10-CM | POA: Diagnosis not present

## 2024-10-06 DIAGNOSIS — I447 Left bundle-branch block, unspecified: Secondary | ICD-10-CM

## 2024-10-06 NOTE — Assessment & Plan Note (Signed)
 EKG shows LBBB. There is no old EKG to compare. Denies chest pain, shortness of breath. Endorses excellent exercise tolerance. Referral placed to Cardiology for evaluation.

## 2024-10-06 NOTE — Progress Notes (Signed)
 Subjective:   Rebecca Sims is a 68 y.o. female who presents for a Welcome to Medicare Exam.   Allergies (verified) Patient has no known allergies.   History: Past Medical History:  Diagnosis Date   Arthritis 2023   Hypothyroidism    Past Surgical History:  Procedure Laterality Date   JOINT REPLACEMENT  2020   TOTAL HIP ARTHROPLASTY Right 03/03/2019   Procedure: TOTAL HIP ARTHROPLASTY ANTERIOR APPROACH;  Surgeon: Ernie Cough, MD;  Location: [facility] OR;  Service: Orthopedics;  Laterality: Right;   Family History  Problem Relation Age of Onset   Arthritis Mother    Social History   Occupational History   Not on file  Tobacco Use   Smoking status: Never   Smokeless tobacco: Never  Vaping Use   Vaping status: Never Used  Substance and Sexual Activity   Alcohol use: Never   Drug use: Never   Sexual activity: Not Currently    Birth control/protection: Post-menopausal   Tobacco Counseling Counseling given: Not Answered  SDOH Screenings   Food Insecurity: No Food Insecurity (10/06/2024)  Housing: High Risk (10/06/2024)  Transportation Needs: No Transportation Needs (10/06/2024)  Utilities: Not At Risk (10/06/2024)  Alcohol Screen: Low Risk  (02/18/2024)  Depression (PHQ2-9): Low Risk  (10/06/2024)  Financial Resource Strain: Low Risk  (02/18/2024)  Physical Activity: Sufficiently Active (02/18/2024)  Social Connections: Socially Isolated (10/06/2024)  Stress: No Stress Concern Present (02/18/2024)  Tobacco Use: Low Risk  (10/06/2024)  Health Literacy: Adequate Health Literacy (10/06/2024)   Depression Screen    10/06/2024    2:29 PM  PHQ 2/9 Scores  PHQ - 2 Score 0     Goals Addressed             This Visit's Progress    Mobility and Independence Optimized       Evidence-based guidance:  Refer to physical therapy or occupational therapy for assessment and individualized program.  Provide therapy that may include functional task training, balance, active or  passive exercise, spasticity management, assistive device training, cardiorespiratory fitness, Pilates and telerehabilitation services.  Identify barriers to participation in therapy or exercise such as pain with activity, anticipated or imagined pain, transportation, depression or fear.  Work with patient and family to develop self-management plan to remove barriers.  Refer to speech/language pathologist to assess and treat speech/language deficit and swallowing impairment.  Periodically review ability to perform activities of daily living and required amount of assistance needed for safety, optimal independence and self-care.  Encourage appropriate vocational or educational counseling for re-entering the community, workplace or school; consider a driving evaluation.  Assist patient to advocate for necessary adaptations to the work or school environment.  Refer to employer for information about FMLA (Family and Medical Leave Act), Short or Long-Term Disability and options for adjustments to work schedule, assignment or hours.   Notes:        Visit info / Clinical Intake: Medicare Wellness Visit Type:: Welcome to [employer]IAL [employer]EARCH OFFICER) Medicare Wellness Visit Mode:: In-person (required for WTM) Interpreter Needed?: No Pre-visit prep was completed: no AWV questionnaire completed by patient prior to visit?: no Living arrangements:: (!) lives alone Patient's Overall Health Status Rating: very good Typical amount of pain: none Does pain affect daily life?: no Are you currently prescribed opioids?: no  Dietary Habits and Nutritional Risks How many meals a day?: 3 Eats fruit and vegetables daily?: yes Most meals are obtained by: preparing own meals Diabetic:: no  Functional Status Activities of Daily Living (  to include ambulation/medication): Independent Ambulation: Independent Medication Administration: Independent Home Management: Independent Manage your own finances?: yes Primary  transportation is: driving Concerns about vision?: (!) yes Concerns about hearing?: no  Fall Screening Falls in the past year?: (Patient-Rptd) 1 Number of falls in past year: (Patient-Rptd) 0 Was there an injury with Fall?: (Patient-Rptd) 0 Fall Risk Category Calculator: (Patient-Rptd) 1 Patient Fall Risk Level: (Patient-Rptd) Low Fall Risk  Fall Risk Patient at Risk for Falls Due to: No Fall Risks Fall risk Follow up: Falls evaluation completed  Home and Transportation Safety: All rugs have non-skid backing?: yes All stairs or steps have railings?: yes Grab bars in the bathtub or shower?: (!) no Have non-skid surface in bathtub or shower?: yes Good home lighting?: yes Regular seat belt use?: yes Hospital stays in the last year:: no  Cognitive Assessment Difficulty concentrating, remembering, or making decisions? : no Will 6CIT or Mini Cog be Completed: yes What year is it?: 0 points What month is it?: 0 points Give patient an address phrase to remember (5 components): [address] [place] About what time is it?: 0 points Count backwards from 20 to 1: 0 points Say the months of the year in reverse: 0 points Repeat the address phrase from earlier: 0 points 6 CIT Score: 0 points  Advance Directives (For Healthcare) Does Patient Have a Medical Advance Directive?: Yes Does patient want to make changes to medical advance directive?: No - Patient declined Type of Advance Directive: Healthcare Power of Attorney         Objective:    Today's Vitals   10/06/24 1426  BP: 131/84  Pulse: 84  Temp: 98.4 F (36.9 C)  TempSrc: Oral  SpO2: 97%  Weight: 180 lb (81.6 kg)  Height: 5' 6 (1.676 m)  PainSc: 0-No pain   Body mass index is 29.05 kg/m.   Physical Exam Vitals and nursing note reviewed.  Constitutional:      General: She is not in acute distress.    Appearance: Normal appearance.  Pulmonary:     Effort: Pulmonary effort is normal.  Skin:     General: Skin is warm and dry.  Neurological:     General: No focal deficit present.     Mental Status: She is alert. Mental status is at baseline.  Psychiatric:        Mood and Affect: Mood normal.        Behavior: Behavior normal.        Thought Content: Thought content normal.        Judgment: Judgment normal.   {Current Medications (verified) Outpatient Encounter Medications as of 10/06/2024  Medication Sig   alendronate  (FOSAMAX ) 70 MG tablet Take 1 tablet (70 mg total) by mouth every 7 (seven) days. Take with a full glass of water on an empty stomach.   Azelastine HCl 137 MCG/SPRAY SOLN Place into both nostrils.   Cholecalciferol  (VITAMIN D -3 PO) Take by mouth.   fexofenadine  (ALLEGRA ) 180 MG tablet Take 1 tablet (180 mg total) by mouth at bedtime.   levothyroxine  (SYNTHROID ) 88 MCG tablet Take 1 tablet (88 mcg total) by mouth daily before breakfast.   Multiple Vitamins-Minerals (ALIVE ONCE DAILY WOMENS 50+) TABS Take 1 tablet by mouth daily with breakfast.   SIMPLY SALINE NA Place 1-2 sprays into both nostrils at bedtime.   No facility-administered encounter medications on file as of 10/06/2024.   Hearing/Vision screen Vision Screening   Right eye Left eye Both eyes  Without correction     With correction 20/15 20/15 20/15   Hearing Screening - Comments:: Grossly intact  Immunizations and Health Maintenance Health Maintenance  Topic Date Due   Hepatitis C Screening  Never done   Mammogram  Never done   Colonoscopy  Never done   COVID-19 Vaccine (4 - 2025-26 season) 11/26/2024   Medicare Annual Wellness (AWV)  10/06/2025   DTaP/Tdap/Td (3 - Td or Tdap) 03/01/2029   Pneumococcal Vaccine: 50+ Years  Completed   Influenza Vaccine  Completed   DEXA SCAN  Completed   Zoster Vaccines- Shingrix   Completed   Meningococcal B Vaccine  Aged Out    EKG: normal EKG, normal sinus rhythm, LBBB No old tracing for comparison. Referral placed to cardiology for evaluation.       Assessment/Plan:  This is a routine wellness examination for Staisha.  Patient Care Team: Booker Darice SAUNDERS, FNP as PCP - General (Family Medicine)  I have personally reviewed and noted the following in the patient's chart:   Medical and social history Use of alcohol, tobacco or illicit drugs  Current medications and supplements including opioid prescriptions. Functional ability and status Nutritional status Physical activity Advanced directives List of other physicians Hospitalizations, surgeries, and ER visits in previous 12 months: n/a  Vitals Screenings to include cognitive, depression, and falls Referrals and appointments  Orders Placed This Encounter  Procedures   Ambulatory referral to Cardiology    Referral Priority:   Urgent    Referral Type:   Consultation    Referral Reason:   Specialty Services Required    Number of Visits Requested:   1   EKG 12-Lead   In addition, I have reviewed and discussed with patient certain preventive protocols, quality metrics, and best practice recommendations. A written personalized care plan for preventive services as well as general preventive health recommendations were provided to patient.   Darice SAUNDERS Booker, FNP   10/07/2024   No follow-ups on file.

## 2024-10-07 NOTE — Addendum Note (Signed)
 Addended by: BOOKER DARICE SAUNDERS on: 10/07/2024 07:57 AM   Modules accepted: Orders

## 2024-10-08 ENCOUNTER — Ambulatory Visit: Attending: Cardiology | Admitting: Cardiology

## 2024-10-08 VITALS — BP 130/84 | HR 74 | Resp 16 | Ht 66.0 in | Wt 178.8 lb

## 2024-10-08 DIAGNOSIS — I447 Left bundle-branch block, unspecified: Secondary | ICD-10-CM | POA: Diagnosis not present

## 2024-10-08 NOTE — Patient Instructions (Signed)
 Medication Instructions:  The current medical regimen is effective;  continue present plan and medications.  *If you need a refill on your cardiac medications before your next appointment, please call your pharmacy*  Lab Work: Please have Lipid panel and HA1c at your primary care office.  If you have labs (blood work) drawn today and your tests are completely normal, you will receive your results only by: MyChart Message (if you have MyChart) OR A paper copy in the mail If you have any lab test that is abnormal or we need to change your treatment, we will call you to review the results.  Testing/Procedures: Your physician has requested that you have an echocardiogram. Echocardiography is a painless test that uses sound waves to create images of your heart. It provides your doctor with information about the size and shape of your heart and how well your heart's chambers and valves are working. This procedure takes approximately one hour. There are no restrictions for this procedure. Please do NOT wear cologne, perfume, aftershave, or lotions (deodorant is allowed). Please arrive 15 minutes prior to your appointment time.  Please note: We ask at that you not bring children with you during ultrasound (echo/ vascular) testing. Due to room size and safety concerns, children are not allowed in the ultrasound rooms during exams. Our front office staff cannot provide observation of children in our lobby area while testing is being conducted. An adult accompanying a patient to their appointment will only be allowed in the ultrasound room at the discretion of the ultrasound technician under special circumstances. We apologize for any inconvenience.   Follow-Up: At [facility], you and your health needs are our priority.  As part of our continuing mission to provide you with exceptional heart care, our providers are all part of one team.  This team includes your primary Cardiologist (physician)  and Advanced Practice Providers or APPs (Physician Assistants and Nurse Practitioners) who all work together to provide you with the care you need, when you need it.  Your next appointment:   1 year(s)  Provider:   Dr Michele      We recommend signing up for the patient portal called MyChart.  Sign up information is provided on this After Visit Summary.  MyChart is used to connect with patients for Virtual Visits (Telemedicine).  Patients are able to view lab/test results, encounter notes, upcoming appointments, etc.  Non-urgent messages can be sent to your provider as well.   To learn more about what you can do with MyChart, go to [email or web address].

## 2024-10-08 NOTE — Progress Notes (Signed)
 Cardiology Office Note:    Date:  10/08/2024  NAME:  Rebecca Sims    MRN: [id number] DOB:  January 28, 1956   PCP:  Booker Darice SAUNDERS, FNP  Former Cardiology Providers: None Primary Cardiologist:  Madonna Large, DO, [facility] (established care 10/08/2024) Electrophysiologist:  None   Referring MD: Booker Darice SAUNDERS, FNP  Reason of Consult: Left bundle branch block  Chief Complaint  Patient presents with   Establish Care    Left bundle    History of Present Illness:    Rebecca Sims is a 68 y.o. Caucasian female whose past medical history and cardiovascular risk factors includes: Left bundle branch block, hypothyroidism. She is being seen today for the evaluation of left bundle branch block at the request of Booker Darice SAUNDERS, FNP.  A left bundle branch block was identified during a 'Welcome to [employer]' preventive visit two days ago. An older EKG from 2020 also showed the same finding, but she was not informed of this at that time. She is asymptomatic with no chest pain, shortness of breath, lightheadedness, dizziness, syncope, orthopnea, or paroxysmal nocturnal dyspnea.  She maintains an active lifestyle, hiking two to three times a week for two to five miles without any change in endurance or symptoms of exhaustion.  Family history includes a father with an irregular heartbeat who lived into his eighties. There is no known family history of early heart disease or myocardial infarctions.  No first degree relatives with premature coronary disease or sudden cardiac death.  Current Medications: Current Meds  Medication Sig   alendronate  (FOSAMAX ) 70 MG tablet Take 1 tablet (70 mg total) by mouth every 7 (seven) days. Take with a full glass of water on an empty stomach.   Azelastine HCl 137 MCG/SPRAY SOLN Place into both nostrils.   Cholecalciferol  (VITAMIN D -3 PO) Take by mouth.   fexofenadine  (ALLEGRA ) 180 MG tablet Take 1 tablet (180 mg total) by mouth at bedtime.   levothyroxine   (SYNTHROID ) 88 MCG tablet Take 1 tablet (88 mcg total) by mouth daily before breakfast.   Multiple Vitamins-Minerals (ALIVE ONCE DAILY WOMENS 50+) TABS Take 1 tablet by mouth daily with breakfast.   SIMPLY SALINE NA Place 1-2 sprays into both nostrils at bedtime.     Allergies:    Patient has no known allergies.   Past Medical History: Past Medical History:  Diagnosis Date   Arthritis 2023   Hypothyroidism     Past Surgical History: Past Surgical History:  Procedure Laterality Date   JOINT REPLACEMENT  2020   TOTAL HIP ARTHROPLASTY Right 03/03/2019   Procedure: TOTAL HIP ARTHROPLASTY ANTERIOR APPROACH;  Surgeon: Ernie Cough, MD;  Location: [facility] OR;  Service: Orthopedics;  Laterality: Right;    Social History: Social History   Tobacco Use   Smoking status: Never   Smokeless tobacco: Never  Vaping Use   Vaping status: Never Used  Substance Use Topics   Alcohol use: Never   Drug use: Never    Family History: Family History  Problem Relation Age of Onset   Arthritis Mother     ROS:   Review of Systems  Cardiovascular:  Negative for chest pain, claudication, irregular heartbeat, leg swelling, near-syncope, orthopnea, palpitations, paroxysmal nocturnal dyspnea and syncope.  Respiratory:  Negative for shortness of breath.   Hematologic/Lymphatic: Negative for bleeding problem.    EKGs/Labs/Other Studies Reviewed:   EKG: EKG Interpretation Date/Time:  Thursday October 08 2024 13:46:22 EST Ventricular Rate:  78 PR Interval:  132 QRS  Duration:  136 QT Interval:  426 QTC Calculation: 485 R Axis:   -39  Text Interpretation: Normal sinus rhythm Left axis deviation Left bundle branch block When compared with ECG of 02-Mar-2019 17:19, No significant change since last tracing Confirmed by Michele Richardson [phone]) on 10/08/2024 1:47:40 PM  Echocardiogram: None  Labs:    Latest Ref Rng & Units 03/05/2019    2:07 AM 03/04/2019    3:25 AM 03/03/2019    2:47 AM  CBC  WBC 4.0  - 10.5 K/uL 7.1  6.9  6.9   Hemoglobin 12.0 - 15.0 g/dL 88.1  87.4  86.0   Hematocrit 36.0 - 46.0 % 35.7  36.5  41.6   Platelets 150 - 400 K/uL 153  165  243        Latest Ref Rng & Units 03/05/2019    2:07 AM 03/04/2019    3:25 AM 03/03/2019    2:47 AM  BMP  Glucose 70 - 99 mg/dL 894  863  895   BUN 8 - 23 mg/dL 15  11  14    Creatinine 0.44 - 1.00 mg/dL 9.47  9.42  9.37   Sodium 135 - 145 mmol/L 135  135  139   Potassium 3.5 - 5.1 mmol/L 3.5  3.5  3.9   Chloride 98 - 111 mmol/L 102  104  105   CO2 22 - 32 mmol/L 27  23  25    Calcium 8.9 - 10.3 mg/dL 8.1  8.3  9.0    8.9       Latest Ref Rng & Units 03/05/2019    2:07 AM 03/04/2019    3:25 AM 03/03/2019    2:47 AM  CMP  Glucose 70 - 99 mg/dL 894  863  895   BUN 8 - 23 mg/dL 15  11  14    Creatinine 0.44 - 1.00 mg/dL 9.47  9.42  9.37   Sodium 135 - 145 mmol/L 135  135  139   Potassium 3.5 - 5.1 mmol/L 3.5  3.5  3.9   Chloride 98 - 111 mmol/L 102  104  105   CO2 22 - 32 mmol/L 27  23  25    Calcium 8.9 - 10.3 mg/dL 8.1  8.3  9.0    8.9   Total Protein 6.5 - 8.1 g/dL   6.0   Total Bilirubin 0.3 - 1.2 mg/dL   1.2   Alkaline Phos 38 - 126 U/L   56   AST 15 - 41 U/L   27   ALT 0 - 44 U/L   21     No results found for: CHOL, HDL, LDLCALC, LDLDIRECT, TRIG, CHOLHDL No results for input(s): LIPOA in the last 8760 hours. No components found for: NTPROBNP No results for input(s): PROBNP in the last 8760 hours. No results for input(s): TSH in the last 8760 hours.  Physical Exam:    Today's Vitals   10/08/24 1344  BP: 130/84  Pulse: 74  Resp: 16  SpO2: 95%  Weight: 178 lb 12.8 oz (81.1 kg)  Height: 5' 6 (1.676 m)   Body mass index is 28.86 kg/m. Wt Readings from Last 3 Encounters:  10/08/24 178 lb 12.8 oz (81.1 kg)  10/06/24 180 lb (81.6 kg)  02/21/24 171 lb (77.6 kg)    Physical Exam  Constitutional: No distress.  hemodynamically stable  Neck: No JVD present.  Cardiovascular: Normal rate, regular  rhythm, S1 normal and S2 normal. Exam reveals no gallop, no  S3 and no S4.  No murmur heard. Pulmonary/Chest: Effort normal and breath sounds normal. No stridor. She has no wheezes. She has no rales.  Musculoskeletal:        General: No edema.     Cervical back: Neck supple.  Skin: Skin is warm.     Impression & Recommendation(s):  Impression:   ICD-10-CM   1. LBBB (left bundle branch block)  I44.7 EKG 12-Lead    ECHOCARDIOGRAM COMPLETE       Recommendation(s):  LBBB (left bundle branch block) Chronic - noted on prior tracing from 2020 Asymptomatic.  No family history of early heart disease.  Regular physical activity without limitations.  Discussed heart's electrical conduction and implications of left bundle branch block.  Emphasized the importance of checking and managing cardiovascular risk factors.  - Ordered echocardiogram to assess heart function and establish baseline. - Will advised follow-up based on echocardiogram results. If LVEF is stable follow up as needed if symptoms arise. If LVEF is reduced will have her come in to discuss additional work up.  - Recommended checking cholesterol and A1c levels to assess heart disease risk factors - will defer to PCP.  - Discussed potential for calcium score if cholesterol levels are borderline or if further risk assessment is warranted - Scheduled follow-up in one year, with option for earlier visit if needed.   Orders Placed:  Orders Placed This Encounter  Procedures   EKG 12-Lead   ECHOCARDIOGRAM COMPLETE    Standing Status:   Future    Expected Date:   10/15/2024    Expiration Date:   10/08/2025    Where should this test be performed[facility]    Does the patient weigh less than or greater than 250 lbs?:   Patient weighs less than 250 lbs    Perflutren DEFINITY (image enhancing agent) should be administered unless hypersensitivity or allergy exist:   Administer Perflutren    Reason for exam-Echo:   Abnormal  ECG  R94.31    Other Comments:   left BBB     Final Medication List:   No orders of the defined types were placed in this encounter.   There are no discontinued medications.   Current Outpatient Medications:    alendronate  (FOSAMAX ) 70 MG tablet, Take 1 tablet (70 mg total) by mouth every 7 (seven) days. Take with a full glass of water on an empty stomach., Disp: 4 tablet, Rfl: 6   Azelastine HCl 137 MCG/SPRAY SOLN, Place into both nostrils., Disp: , Rfl:    Cholecalciferol  (VITAMIN D -3 PO), Take by mouth., Disp: , Rfl:    fexofenadine  (ALLEGRA ) 180 MG tablet, Take 1 tablet (180 mg total) by mouth at bedtime., Disp: 30 tablet, Rfl: 3   levothyroxine  (SYNTHROID ) 88 MCG tablet, Take 1 tablet (88 mcg total) by mouth daily before breakfast., Disp: 90 tablet, Rfl: 2   Multiple Vitamins-Minerals (ALIVE ONCE DAILY WOMENS 50+) TABS, Take 1 tablet by mouth daily with breakfast., Disp: , Rfl:    SIMPLY SALINE NA, Place 1-2 sprays into both nostrils at bedtime., Disp: , Rfl:   Consent:   NA  Disposition:   1 year follow up - sooner if needed.  Patient may be asked to follow-up sooner based on the results of the above-mentioned testing.  Her questions and concerns were addressed to her satisfaction. She voices understanding of the recommendations provided during this encounter.    Signed, Madonna Large, DO, [facility]  A Division of [facility] [address], [place], [place] [address]  10/08/2024 2:15 PM

## 2024-10-19 ENCOUNTER — Other Ambulatory Visit: Payer: Self-pay | Admitting: Family Medicine

## 2024-10-20 ENCOUNTER — Ambulatory Visit: Admitting: Family Medicine

## 2024-11-04 ENCOUNTER — Ambulatory Visit (INDEPENDENT_AMBULATORY_CARE_PROVIDER_SITE_OTHER): Admitting: Family Medicine

## 2024-11-04 ENCOUNTER — Encounter: Payer: Self-pay | Admitting: Family Medicine

## 2024-11-04 VITALS — BP 137/79 | HR 65 | Temp 98.1°F | Ht 66.0 in | Wt 177.3 lb

## 2024-11-04 DIAGNOSIS — E039 Hypothyroidism, unspecified: Secondary | ICD-10-CM

## 2024-11-04 DIAGNOSIS — Z136 Encounter for screening for cardiovascular disorders: Secondary | ICD-10-CM | POA: Diagnosis not present

## 2024-11-04 DIAGNOSIS — Z0000 Encounter for general adult medical examination without abnormal findings: Secondary | ICD-10-CM | POA: Diagnosis not present

## 2024-11-04 DIAGNOSIS — Z13228 Encounter for screening for other metabolic disorders: Secondary | ICD-10-CM

## 2024-11-04 DIAGNOSIS — Z130 Encounter for screening for diseases of the blood and blood-forming organs and certain disorders involving the immune mechanism: Secondary | ICD-10-CM

## 2024-11-04 DIAGNOSIS — Z13220 Encounter for screening for lipoid disorders: Secondary | ICD-10-CM

## 2024-11-04 DIAGNOSIS — E042 Nontoxic multinodular goiter: Secondary | ICD-10-CM | POA: Insufficient documentation

## 2024-11-04 DIAGNOSIS — Z1231 Encounter for screening mammogram for malignant neoplasm of breast: Secondary | ICD-10-CM

## 2024-11-04 DIAGNOSIS — Z1211 Encounter for screening for malignant neoplasm of colon: Secondary | ICD-10-CM

## 2024-11-04 NOTE — Progress Notes (Signed)
 Complete physical exam  Patient: Rebecca Sims   DOB: [birth year]   68 y.o. Female  MRN: [id number]  Subjective:    Chief Complaint  Patient presents with   Annual Exam    Rebecca Sims is a 68 y.o. female who presents today for a complete physical exam. She reports consuming a general diet. Walking and in home exercise.  She generally feels well. She reports sleeping well. She does not have additional problems to discuss today.    Most recent fall risk assessment:    11/04/2024    8:49 AM  Fall Risk   Falls in the past year? 1  Number falls in past yr: 1  Injury with Fall? 0  Risk for fall due to : Impaired balance/gait  Follow up Falls evaluation completed;Education provided     Most recent depression screenings:    11/04/2024    8:50 AM 10/06/2024    2:29 PM  PHQ 2/9 Scores  PHQ - 2 Score 0 0  PHQ- 9 Score 0     Vision:Within last year and Dental: No current dental problems and Receives regular dental care    Patient Care Team: Booker Darice SAUNDERS, FNP as PCP - General (Family Medicine) Michele Richardson, DO as PCP - Cardiology (Cardiology)   Outpatient Medications Prior to Visit  Medication Sig   alendronate  (FOSAMAX ) 70 MG tablet Take 1 tablet (70 mg total) by mouth every 7 (seven) days. Take with a full glass of water on an empty stomach.   Azelastine HCl 137 MCG/SPRAY SOLN Place into both nostrils.   Cholecalciferol  (VITAMIN D -3 PO) Take by mouth.   fexofenadine  (ALLEGRA ) 180 MG tablet Take 1 tablet (180 mg total) by mouth at bedtime.   levothyroxine  (SYNTHROID ) 88 MCG tablet Take 1 tablet (88 mcg total) by mouth daily before breakfast.   Multiple Vitamins-Minerals (ALIVE ONCE DAILY WOMENS 50+) TABS Take 1 tablet by mouth daily with breakfast.   SIMPLY SALINE NA Place 1-2 sprays into both nostrils at bedtime.   No facility-administered medications prior to visit.    ROS        Objective:     BP 137/79 (BP Location: Left Arm, Patient  Position: Sitting, Cuff Size: Normal)   Pulse 65   Temp 98.1 F (36.7 C) (Oral)   Ht 5' 6 (1.676 m)   Wt 177 lb 4.8 oz (80.4 kg)   SpO2 97%   BMI 28.62 kg/m    Physical Exam Vitals and nursing note reviewed.  Constitutional:      General: She is not in acute distress.    Appearance: Normal appearance.  HENT:     Right Ear: Tympanic membrane normal.     Left Ear: Tympanic membrane normal.     Nose: Nose normal.     Mouth/Throat:     Mouth: Mucous membranes are moist.     Pharynx: Oropharynx is clear.  Eyes:     Extraocular Movements: Extraocular movements intact.  Neck:     Thyroid : No thyroid  tenderness.  Cardiovascular:     Rate and Rhythm: Normal rate and regular rhythm.     Pulses:          Radial pulses are 2+ on the right side and 2+ on the left side.     Heart sounds: Normal heart sounds, S1 normal and S2 normal.  Pulmonary:     Effort: Pulmonary effort is normal.     Breath sounds: Normal breath sounds.  Abdominal:  General: Bowel sounds are normal.     Palpations: Abdomen is soft.     Tenderness: There is no abdominal tenderness.  Musculoskeletal:        General: Normal range of motion.     Cervical back: Normal range of motion.     Right lower leg: No edema.     Left lower leg: No edema.  Lymphadenopathy:     Cervical:     Right cervical: No superficial cervical adenopathy.    Left cervical: No superficial cervical adenopathy.  Skin:    General: Skin is warm and dry.  Neurological:     General: No focal deficit present.     Mental Status: She is alert. Mental status is at baseline.  Psychiatric:        Mood and Affect: Mood normal.        Behavior: Behavior normal.        Thought Content: Thought content normal.        Judgment: Judgment normal.      No results found for any visits on 11/04/24.     Assessment & Plan:    Routine Health Maintenance and Physical Exam  Immunization History  Administered Date(s) Administered    Influenza-Unspecified 10/01/2024   Moderna Sars-Covid-2 Vaccination 09/11/2022   PFIZER(Purple Top)SARS-COV-2 Vaccination 12/14/2020, 10/01/2024   PNEUMOCOCCAL CONJUGATE-20 11/22/2023   Tdap 04/10/2013, 03/02/2019   Zoster Recombinant(Shingrix ) 11/22/2023, 05/21/2024    Health Maintenance  Topic Date Due   Hepatitis C Screening  Never done   Mammogram  Never done   Colonoscopy  Never done   COVID-19 Vaccine (4 - 2025-26 season) 11/26/2024   Medicare Annual Wellness (AWV)  10/06/2025   DTaP/Tdap/Td (3 - Td or Tdap) 03/01/2029   Pneumococcal Vaccine: 50+ Years  Completed   Influenza Vaccine  Completed   Bone Density Scan  Completed   Zoster Vaccines- Shingrix   Completed   Meningococcal B Vaccine  Aged Out    Discussed health benefits of physical activity, and encouraged her to engage in regular exercise appropriate for her age and condition.  Annual physical exam -     CBC -     Comprehensive metabolic panel with GFR -     Hemoglobin A1c -     Lipid panel -     TSH + free T4  Hypothyroidism, unspecified type -     TSH + free T4  Encounter for lipid screening for cardiovascular disease -     Lipid panel  Encounter for screening for metabolic disorder -     Comprehensive metabolic panel with GFR -     Hemoglobin A1c  Screening for deficiency anemia -     CBC  Screening for colon cancer -     Cologuard  Encounter for screening mammogram for malignant neoplasm of breast -     Digital Screening Mammogram, Left and Right      Routine labs ordered.  HCM reviewed/discussed. Cologuard and mammogram ordered today. Anticipatory guidance regarding healthy weight, lifestyle and choices given. Recommend healthy diet.  Recommend approximately 150 minutes/week of moderate intensity exercise. Resistance training is good for building muscles and for bone health. Muscle mass helps to increase our metabolism and to burn more calories at rest.  Limit alcohol consumption: no more  than one drink per day for women and 2 drinks per day for me. Recommend regular dental and vision exams. Always use seatbelt/lap and shoulder restraints. Recommend using smoke alarms and checking batteries at least twice  a year. Recommend using sunscreen when outside. Agrees with plan of care discussed.  Questions answered.      Return in about 6 months (around 05/05/2025) for Hypothyroid.     [name], FNP

## 2024-11-05 ENCOUNTER — Ambulatory Visit: Payer: Self-pay | Admitting: Family Medicine

## 2024-11-05 DIAGNOSIS — E039 Hypothyroidism, unspecified: Secondary | ICD-10-CM

## 2024-11-05 DIAGNOSIS — E7841 Elevated Lipoprotein(a): Secondary | ICD-10-CM | POA: Insufficient documentation

## 2024-11-05 LAB — LIPID PANEL
Chol/HDL Ratio: 5.5 ratio — ABNORMAL HIGH (ref 0.0–4.4)
Cholesterol, Total: 233 mg/dL — ABNORMAL HIGH (ref 100–199)
HDL: 42 mg/dL (ref >39)
LDL Chol Calc (NIH): 147 mg/dL — ABNORMAL HIGH (ref 0–99)
Triglycerides: 239 mg/dL — ABNORMAL HIGH (ref 0–149)
VLDL Cholesterol Cal: 44 mg/dL — ABNORMAL HIGH (ref 5–40)

## 2024-11-05 LAB — HEMOGLOBIN A1C
Est. average glucose Bld gHb Est-mCnc: 111 mg/dL
Hgb A1c MFr Bld: 5.5 % (ref 4.8–5.6)

## 2024-11-05 LAB — COMPREHENSIVE METABOLIC PANEL WITH GFR
ALT: 34 IU/L — ABNORMAL HIGH (ref 0–32)
AST: 29 IU/L (ref 0–40)
Albumin: 4.6 g/dL (ref 3.9–4.9)
Alkaline Phosphatase: 111 IU/L (ref 49–135)
BUN/Creatinine Ratio: 25 (ref 12–28)
BUN: 17 mg/dL (ref 8–27)
Bilirubin Total: 0.6 mg/dL (ref 0.0–1.2)
CO2: 23 mmol/L (ref 20–29)
Calcium: 9.7 mg/dL (ref 8.7–10.3)
Chloride: 100 mmol/L (ref 96–106)
Creatinine, Ser: 0.68 mg/dL (ref 0.57–1.00)
Globulin, Total: 2.2 g/dL (ref 1.5–4.5)
Glucose: 101 mg/dL — ABNORMAL HIGH (ref 70–99)
Potassium: 5 mmol/L (ref 3.5–5.2)
Sodium: 140 mmol/L (ref 134–144)
Total Protein: 6.8 g/dL (ref 6.0–8.5)
eGFR: 95 mL/min/1.73 (ref >59)

## 2024-11-05 LAB — CBC
Hematocrit: 45.7 % (ref 34.0–46.6)
Hemoglobin: 15.6 g/dL (ref 11.1–15.9)
MCH: 33.5 pg — ABNORMAL HIGH (ref 26.6–33.0)
MCHC: 34.1 g/dL (ref 31.5–35.7)
MCV: 98 fL — ABNORMAL HIGH (ref 79–97)
Platelets: 339 x10E3/uL (ref 150–450)
RBC: 4.66 x10E6/uL (ref 3.77–5.28)
RDW: 12.5 % (ref 11.7–15.4)
WBC: 5.8 x10E3/uL (ref 3.4–10.8)

## 2024-11-05 LAB — TSH+FREE T4
Free T4: 1.26 ng/dL (ref 0.82–1.77)
TSH: 2.99 u[IU]/mL (ref 0.450–4.500)

## 2024-11-05 MED ORDER — LEVOTHYROXINE SODIUM 88 MCG PO TABS: 88.0000 ug | ORAL_TABLET | Freq: Every day | ORAL | 3 refills | Status: AC

## 2024-11-05 MED ORDER — ATORVASTATIN CALCIUM 10 MG PO TABS: 10.0000 mg | ORAL_TABLET | Freq: Every day | ORAL | 3 refills | Status: AC

## 2024-11-10 ENCOUNTER — Ambulatory Visit (HOSPITAL_COMMUNITY)
Admission: RE | Admit: 2024-11-10 | Discharge: 2024-11-10 | Disposition: A | Source: Ambulatory Visit | Attending: Cardiology

## 2024-11-10 ENCOUNTER — Ambulatory Visit: Payer: Self-pay | Admitting: Cardiology

## 2024-11-10 DIAGNOSIS — Z1211 Encounter for screening for malignant neoplasm of colon: Secondary | ICD-10-CM | POA: Diagnosis not present

## 2024-11-10 DIAGNOSIS — I447 Left bundle-branch block, unspecified: Secondary | ICD-10-CM

## 2024-11-10 LAB — ECHOCARDIOGRAM COMPLETE
AR max vel: 2.54 cm2
AV Area VTI: 2.65 cm2
AV Area mean vel: 2.56 cm2
AV Mean grad: 4 mmHg
AV Peak grad: 7.6 mmHg
Ao pk vel: 1.38 m/s
Area-P 1/2: 3.08 cm2
S' Lateral: 1.74 cm

## 2024-11-10 MED ORDER — PERFLUTREN LIPID MICROSPHERE
1.0000 mL | INTRAVENOUS | Status: AC | PRN
  Administered 2024-11-10: 4 mL via INTRAVENOUS

## 2024-11-15 LAB — COLOGUARD: COLOGUARD: NEGATIVE

## 2024-12-08 ENCOUNTER — Ambulatory Visit: Admitting: Family Medicine

## 2024-12-08 ENCOUNTER — Encounter: Payer: Self-pay | Admitting: Family Medicine

## 2024-12-08 VITALS — BP 120/75 | HR 73 | Temp 98.0°F | Ht 66.0 in | Wt 178.0 lb

## 2024-12-08 DIAGNOSIS — H66002 Acute suppurative otitis media without spontaneous rupture of ear drum, left ear: Secondary | ICD-10-CM | POA: Diagnosis not present

## 2024-12-08 MED ORDER — OFLOXACIN 0.3 % OT SOLN: 5.0000 [drp] | Freq: Every day | OTIC | 0 refills | Status: AC

## 2024-12-08 MED ORDER — PREDNISONE 50 MG PO TABS: ORAL_TABLET | ORAL | 0 refills | Status: AC

## 2024-12-08 NOTE — Progress Notes (Signed)
 "  Acute Office Visit  Subjective:     Patient ID: Rebecca Sims, female    DOB: 05-24-1956, 69 y.o.   MRN: [id number]  Chief Complaint  Patient presents with   Ear Pain    Went to urgent care 12/26 for left ear drainage. Given Cefdinir.     HPI Diagnosed with OM at walk in clinic on 12/26. Completed Cefdinir 300 mg BID yesterday. Reports intermittent pain in left ear that radiates towards the jaw. Hearing is decreased. Symptoms have improved since diagnosis. Ibuprofen is helping the pain. Allergist instructs her to hold her nose and blow to clear her ear, this is not helping.  Flonase nasal spray is not helping.   Chart review:  11/27/24: : left otitis media Cefdinir 300 mg BID x 10 days Completed course yesterday.    Review of Systems  HENT:  Positive for ear pain and hearing loss.         Objective:    BP 120/75 (BP Location: Left Arm, Patient Position: Sitting, Cuff Size: Large)   Pulse 73   Temp 98 F (36.7 C) (Oral)   Ht 5' 6 (1.676 m)   Wt 178 lb (80.7 kg)   SpO2 96%   BMI 28.73 kg/m  BP Readings from Last 3 Encounters:  12/08/24 120/75  11/04/24 137/79  10/08/24 130/84      Physical Exam Vitals and nursing note reviewed.  Constitutional:      General: She is not in acute distress.    Appearance: Normal appearance.  HENT:     Right Ear: Tympanic membrane normal.     Left Ear: Tympanic membrane is erythematous.  Cardiovascular:     Heart sounds: Normal heart sounds.  Pulmonary:     Effort: Pulmonary effort is normal.     Breath sounds: Normal breath sounds.  Skin:    General: Skin is warm and dry.  Neurological:     General: No focal deficit present.     Mental Status: She is alert. Mental status is at baseline.  Psychiatric:        Mood and Affect: Mood normal.        Behavior: Behavior normal.        Thought Content: Thought content normal.        Judgment: Judgment normal.     No results found for any visits on  12/08/24.      Assessment & Plan:   Problem List Items Addressed This Visit     Acute suppurative otitis media of left ear without spontaneous rupture of tympanic membrane - Primary   Diagnosed with OM at walk in clinic on 12/26. Completed Cefdinir 300 mg BID yesterday. Reports intermittent pain in left ear that radiates towards the jaw. Hearing is decreased. Left TM with erythema.  Symptoms have improved since diagnosis. Ibuprofen is helping the pain. Allergist instructs her to hold her nose and blow to clear her ear, this is not helping.  Flonase nasal spray is not helping.  Ofloxacin  0.3% otic solution 5 drops in left ear daily. Prednisone  50 mg daily for 5 days. Hold ibuprofen while taking prednisone .  Follow-up if symptoms do not resolve.        Meds ordered this encounter  Medications   ofloxacin  (FLOXIN ) 0.3 % OTIC solution    Sig: Place 5 drops into the left ear daily.    Dispense:  5 mL    Refill:  0    Supervising Provider:   ALVAN,  CATHERINE D [[id number]]   predniSONE  (DELTASONE ) 50 MG tablet    Sig: Take one tablet by mouth daily for 5 days.    Dispense:  5 tablet    Refill:  0    Supervising Provider:   METHENEY, CATHERINE D [[id number]]  Agrees with plan of care discussed.  Questions answered.   Return if symptoms worsen or fail to improve.  Rebecca JONELLE Brownie, FNP   "

## 2024-12-08 NOTE — Assessment & Plan Note (Addendum)
 Diagnosed with OM at walk in clinic on 12/26. Completed Cefdinir 300 mg BID yesterday. Reports intermittent pain in left ear that radiates towards the jaw. Hearing is decreased. Left TM with erythema.  Symptoms have improved since diagnosis. Ibuprofen is helping the pain. Allergist instructs her to hold her nose and blow to clear her ear, this is not helping.  Flonase nasal spray is not helping.  Ofloxacin  0.3% otic solution 5 drops in left ear daily. Prednisone  50 mg daily for 5 days. Hold ibuprofen while taking prednisone .  Follow-up if symptoms do not resolve.

## 2024-12-26 ENCOUNTER — Other Ambulatory Visit: Payer: Self-pay | Admitting: Family Medicine

## 2024-12-26 DIAGNOSIS — M8580 Other specified disorders of bone density and structure, unspecified site: Secondary | ICD-10-CM

## 2025-05-06 ENCOUNTER — Ambulatory Visit: Admitting: Family Medicine
